# Patient Record
Sex: Female | Born: 1980 | Race: Black or African American | Hispanic: No | Marital: Single | Smoking: Former smoker
Health system: Southern US, Community
[De-identification: ages and names within clinical notes are randomized; demographics above are authoritative.]

## PROBLEM LIST (undated history)

## (undated) ENCOUNTER — Inpatient Hospital Stay (HOSPITAL_COMMUNITY): Payer: Self-pay

## (undated) ENCOUNTER — Emergency Department (HOSPITAL_COMMUNITY): Discharge status: Absent without leave | Payer: Medicaid Other

## (undated) ENCOUNTER — Ambulatory Visit (HOSPITAL_COMMUNITY): Admission: EM | Payer: Medicaid Other | Source: Home / Self Care

## (undated) ENCOUNTER — Ambulatory Visit (HOSPITAL_COMMUNITY): Admission: EM | Source: Home / Self Care

## (undated) DIAGNOSIS — O1420 HELLP syndrome (HELLP), unspecified trimester: Secondary | ICD-10-CM

## (undated) DIAGNOSIS — D649 Anemia, unspecified: Secondary | ICD-10-CM

## (undated) DIAGNOSIS — T7840XA Allergy, unspecified, initial encounter: Secondary | ICD-10-CM

## (undated) DIAGNOSIS — D696 Thrombocytopenia, unspecified: Secondary | ICD-10-CM

## (undated) DIAGNOSIS — F419 Anxiety disorder, unspecified: Secondary | ICD-10-CM

## (undated) DIAGNOSIS — Z87898 Personal history of other specified conditions: Secondary | ICD-10-CM

## (undated) DIAGNOSIS — O139 Gestational [pregnancy-induced] hypertension without significant proteinuria, unspecified trimester: Secondary | ICD-10-CM

## (undated) DIAGNOSIS — O1490 Unspecified pre-eclampsia, unspecified trimester: Secondary | ICD-10-CM

## (undated) HISTORY — PX: TUBAL LIGATION: SHX77

## (undated) HISTORY — DX: Anemia, unspecified: D64.9

## (undated) HISTORY — DX: Anxiety disorder, unspecified: F41.9

## (undated) HISTORY — DX: Personal history of other specified conditions: Z87.898

## (undated) HISTORY — DX: Allergy, unspecified, initial encounter: T78.40XA

## (undated) HISTORY — PX: TOOTH EXTRACTION: SHX859

---
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
## 2000-07-18 DIAGNOSIS — O1420 HELLP syndrome (HELLP), unspecified trimester: Secondary | ICD-10-CM

## 2000-07-18 HISTORY — DX: HELLP syndrome (HELLP), unspecified trimester: O14.20

## 2001-02-27 ENCOUNTER — Encounter (INDEPENDENT_AMBULATORY_CARE_PROVIDER_SITE_OTHER): Payer: Self-pay | Admitting: Specialist

## 2001-02-27 ENCOUNTER — Inpatient Hospital Stay (HOSPITAL_COMMUNITY): Admission: AD | Admit: 2001-02-27 | Discharge: 2001-03-06 | Payer: Self-pay | Admitting: Obstetrics & Gynecology

## 2001-02-28 ENCOUNTER — Encounter: Payer: Self-pay | Admitting: Obstetrics

## 2001-03-01 ENCOUNTER — Encounter: Payer: Self-pay | Admitting: *Deleted

## 2001-03-03 ENCOUNTER — Encounter: Payer: Self-pay | Admitting: *Deleted

## 2001-09-24 ENCOUNTER — Encounter: Payer: Self-pay | Admitting: *Deleted

## 2001-09-24 ENCOUNTER — Ambulatory Visit (HOSPITAL_COMMUNITY): Admission: RE | Admit: 2001-09-24 | Discharge: 2001-09-24 | Payer: Self-pay | Admitting: *Deleted

## 2001-09-26 ENCOUNTER — Encounter: Admission: RE | Admit: 2001-09-26 | Discharge: 2001-09-26 | Payer: Self-pay | Admitting: *Deleted

## 2001-10-03 ENCOUNTER — Observation Stay (HOSPITAL_COMMUNITY): Admission: AD | Admit: 2001-10-03 | Discharge: 2001-10-04 | Payer: Self-pay | Admitting: *Deleted

## 2001-10-03 ENCOUNTER — Encounter: Admission: RE | Admit: 2001-10-03 | Discharge: 2001-10-03 | Payer: Self-pay | Admitting: *Deleted

## 2001-10-10 ENCOUNTER — Encounter: Admission: RE | Admit: 2001-10-10 | Discharge: 2001-10-10 | Payer: Self-pay | Admitting: *Deleted

## 2001-10-11 ENCOUNTER — Inpatient Hospital Stay (HOSPITAL_COMMUNITY): Admission: AD | Admit: 2001-10-11 | Discharge: 2001-10-14 | Payer: Self-pay | Admitting: *Deleted

## 2001-10-24 ENCOUNTER — Ambulatory Visit (HOSPITAL_COMMUNITY): Admission: RE | Admit: 2001-10-24 | Discharge: 2001-10-24 | Payer: Self-pay | Admitting: *Deleted

## 2001-10-25 ENCOUNTER — Encounter: Admission: RE | Admit: 2001-10-25 | Discharge: 2001-10-25 | Payer: Self-pay | Admitting: *Deleted

## 2001-11-15 ENCOUNTER — Encounter: Admission: RE | Admit: 2001-11-15 | Discharge: 2001-11-15 | Payer: Self-pay | Admitting: *Deleted

## 2001-11-16 ENCOUNTER — Inpatient Hospital Stay (HOSPITAL_COMMUNITY): Admission: AD | Admit: 2001-11-16 | Discharge: 2001-11-16 | Payer: Self-pay | Admitting: *Deleted

## 2001-11-22 ENCOUNTER — Encounter: Admission: RE | Admit: 2001-11-22 | Discharge: 2001-11-22 | Payer: Self-pay | Admitting: *Deleted

## 2001-11-29 ENCOUNTER — Inpatient Hospital Stay (HOSPITAL_COMMUNITY): Admission: AD | Admit: 2001-11-29 | Discharge: 2001-12-06 | Payer: Self-pay | Admitting: *Deleted

## 2001-11-29 ENCOUNTER — Encounter (INDEPENDENT_AMBULATORY_CARE_PROVIDER_SITE_OTHER): Payer: Self-pay

## 2002-07-18 DIAGNOSIS — O1490 Unspecified pre-eclampsia, unspecified trimester: Secondary | ICD-10-CM

## 2002-07-18 HISTORY — DX: Unspecified pre-eclampsia, unspecified trimester: O14.90

## 2004-07-04 ENCOUNTER — Emergency Department (HOSPITAL_COMMUNITY): Admission: EM | Admit: 2004-07-04 | Discharge: 2004-07-04 | Payer: Self-pay | Admitting: Emergency Medicine

## 2005-03-24 ENCOUNTER — Emergency Department (HOSPITAL_COMMUNITY): Admission: EM | Admit: 2005-03-24 | Discharge: 2005-03-24 | Payer: Self-pay | Admitting: Emergency Medicine

## 2005-10-25 ENCOUNTER — Emergency Department (HOSPITAL_COMMUNITY): Admission: EM | Admit: 2005-10-25 | Discharge: 2005-10-25 | Payer: Self-pay | Admitting: Family Medicine

## 2005-10-27 ENCOUNTER — Emergency Department (HOSPITAL_COMMUNITY): Admission: EM | Admit: 2005-10-27 | Discharge: 2005-10-28 | Payer: Self-pay | Admitting: Emergency Medicine

## 2005-10-29 ENCOUNTER — Emergency Department (HOSPITAL_COMMUNITY): Admission: EM | Admit: 2005-10-29 | Discharge: 2005-10-29 | Payer: Self-pay | Admitting: Family Medicine

## 2006-01-11 ENCOUNTER — Emergency Department (HOSPITAL_COMMUNITY): Admission: EM | Admit: 2006-01-11 | Discharge: 2006-01-11 | Payer: Self-pay | Admitting: Family Medicine

## 2006-12-24 ENCOUNTER — Emergency Department (HOSPITAL_COMMUNITY): Admission: EM | Admit: 2006-12-24 | Discharge: 2006-12-24 | Payer: Self-pay | Admitting: Emergency Medicine

## 2007-02-12 ENCOUNTER — Emergency Department (HOSPITAL_COMMUNITY): Admission: EM | Admit: 2007-02-12 | Discharge: 2007-02-12 | Payer: Self-pay | Admitting: Emergency Medicine

## 2007-08-14 ENCOUNTER — Emergency Department (HOSPITAL_COMMUNITY): Admission: EM | Admit: 2007-08-14 | Discharge: 2007-08-14 | Payer: Self-pay | Admitting: Emergency Medicine

## 2007-09-06 ENCOUNTER — Emergency Department (HOSPITAL_COMMUNITY): Admission: EM | Admit: 2007-09-06 | Discharge: 2007-09-06 | Payer: Self-pay | Admitting: Emergency Medicine

## 2007-09-26 ENCOUNTER — Inpatient Hospital Stay (HOSPITAL_COMMUNITY): Admission: AD | Admit: 2007-09-26 | Discharge: 2007-09-26 | Payer: Self-pay | Admitting: Obstetrics & Gynecology

## 2007-11-21 ENCOUNTER — Inpatient Hospital Stay (HOSPITAL_COMMUNITY): Admission: AD | Admit: 2007-11-21 | Discharge: 2007-11-21 | Payer: Self-pay | Admitting: Family Medicine

## 2007-12-20 ENCOUNTER — Ambulatory Visit: Payer: Self-pay | Admitting: Obstetrics & Gynecology

## 2007-12-24 ENCOUNTER — Other Ambulatory Visit: Payer: Self-pay | Admitting: Emergency Medicine

## 2007-12-24 ENCOUNTER — Inpatient Hospital Stay (HOSPITAL_COMMUNITY): Admission: AD | Admit: 2007-12-24 | Discharge: 2007-12-25 | Payer: Self-pay | Admitting: Gynecology

## 2007-12-24 ENCOUNTER — Ambulatory Visit: Payer: Self-pay | Admitting: Gynecology

## 2007-12-26 ENCOUNTER — Ambulatory Visit: Payer: Self-pay | Admitting: Obstetrics & Gynecology

## 2008-01-03 ENCOUNTER — Ambulatory Visit: Payer: Self-pay | Admitting: Obstetrics & Gynecology

## 2008-01-10 ENCOUNTER — Ambulatory Visit: Payer: Self-pay | Admitting: Family Medicine

## 2008-01-24 ENCOUNTER — Ambulatory Visit: Payer: Self-pay | Admitting: Family Medicine

## 2008-01-31 ENCOUNTER — Ambulatory Visit: Payer: Self-pay | Admitting: *Deleted

## 2008-01-31 ENCOUNTER — Ambulatory Visit (HOSPITAL_COMMUNITY): Admission: RE | Admit: 2008-01-31 | Discharge: 2008-01-31 | Payer: Self-pay | Admitting: Obstetrics & Gynecology

## 2008-02-11 ENCOUNTER — Ambulatory Visit: Payer: Self-pay | Admitting: Family Medicine

## 2008-02-14 ENCOUNTER — Ambulatory Visit: Payer: Self-pay | Admitting: *Deleted

## 2008-02-25 ENCOUNTER — Ambulatory Visit: Payer: Self-pay | Admitting: Obstetrics & Gynecology

## 2008-03-05 ENCOUNTER — Inpatient Hospital Stay (HOSPITAL_COMMUNITY): Admission: AD | Admit: 2008-03-05 | Discharge: 2008-03-07 | Payer: Self-pay | Admitting: Obstetrics & Gynecology

## 2008-03-05 ENCOUNTER — Encounter: Payer: Self-pay | Admitting: Obstetrics & Gynecology

## 2008-03-08 ENCOUNTER — Inpatient Hospital Stay (HOSPITAL_COMMUNITY): Admission: AD | Admit: 2008-03-08 | Discharge: 2008-03-08 | Payer: Self-pay | Admitting: Family Medicine

## 2008-03-08 ENCOUNTER — Ambulatory Visit: Payer: Self-pay | Admitting: Physician Assistant

## 2009-06-04 ENCOUNTER — Ambulatory Visit (HOSPITAL_COMMUNITY): Admission: RE | Admit: 2009-06-04 | Discharge: 2009-06-04 | Payer: Self-pay | Admitting: Obstetrics

## 2009-07-22 ENCOUNTER — Ambulatory Visit (HOSPITAL_COMMUNITY): Admission: RE | Admit: 2009-07-22 | Discharge: 2009-07-22 | Payer: Self-pay | Admitting: Obstetrics

## 2009-08-06 ENCOUNTER — Inpatient Hospital Stay (HOSPITAL_COMMUNITY): Admission: AD | Admit: 2009-08-06 | Discharge: 2009-08-06 | Payer: Self-pay | Admitting: Obstetrics

## 2009-08-07 ENCOUNTER — Inpatient Hospital Stay (HOSPITAL_COMMUNITY): Admission: AD | Admit: 2009-08-07 | Discharge: 2009-08-10 | Payer: Self-pay | Admitting: Obstetrics

## 2009-08-07 ENCOUNTER — Encounter (INDEPENDENT_AMBULATORY_CARE_PROVIDER_SITE_OTHER): Payer: Self-pay | Admitting: Obstetrics

## 2010-01-25 IMAGING — US US OB FOLLOW-UP
1 series · 14 of 28 positions shown · non-contrast
Comparison: none

OBSTETRICAL ULTRASOUND:
 This ultrasound exam was performed in the [HOSPITAL] Ultrasound Department.  The OB US report was generated in the AS system, and faxed to the ordering physician.  This report is also available in [REDACTED] PACS.

[Series 1: us ob follow-up · 0.30mm/px · 14 of 30 slices shown]
[im 2/30]
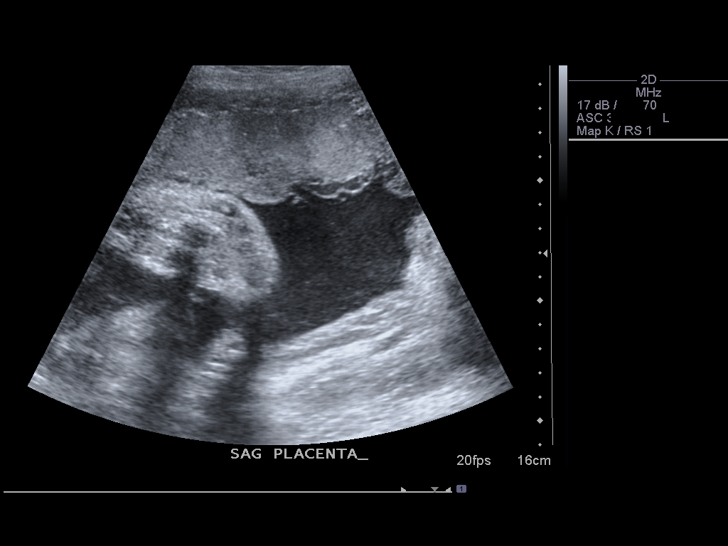
[im 4/30]
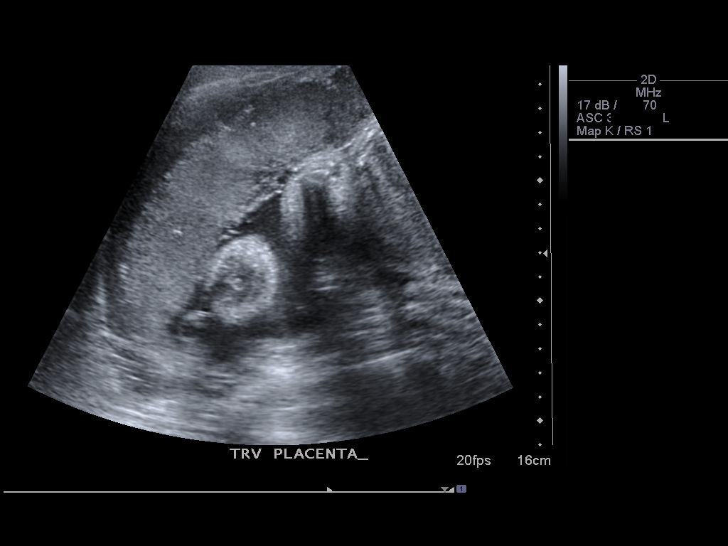
[im 6/30]
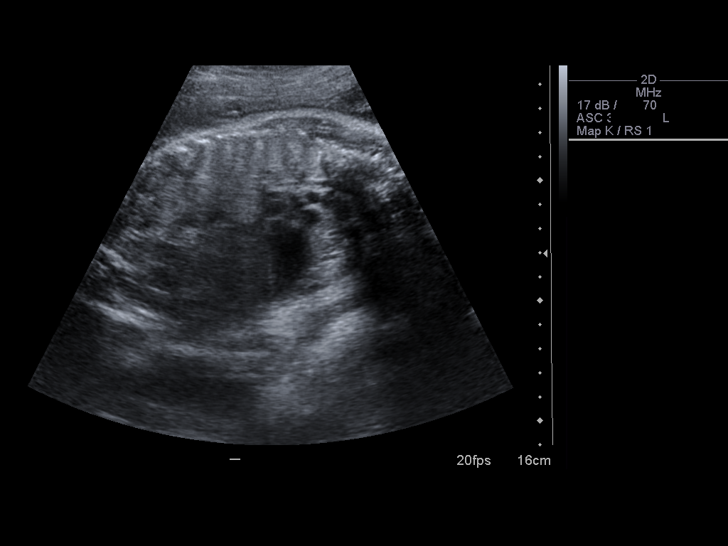
[im 8/30]
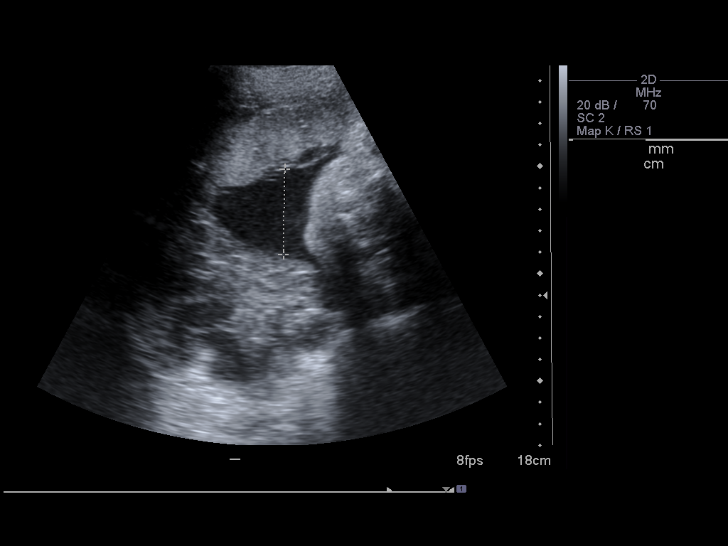
[im 10/30]
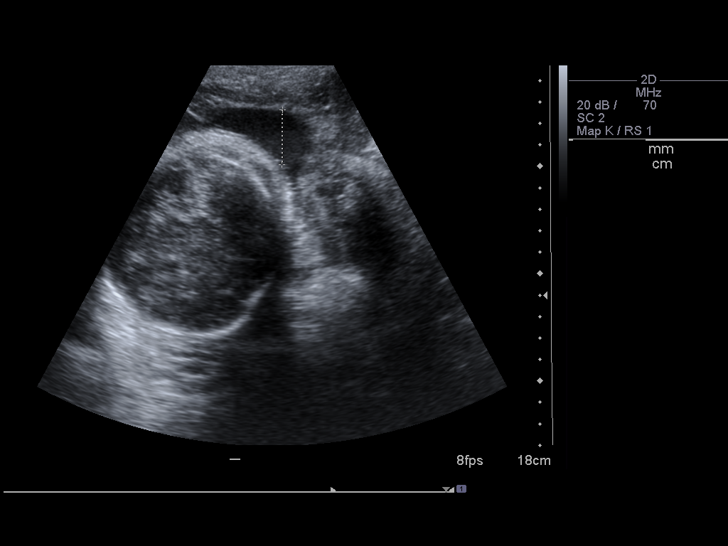
[im 12/30]
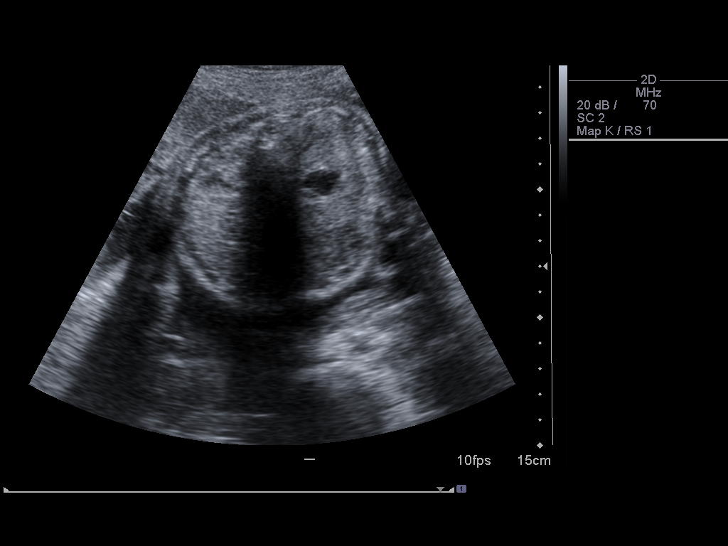
[im 14/30]
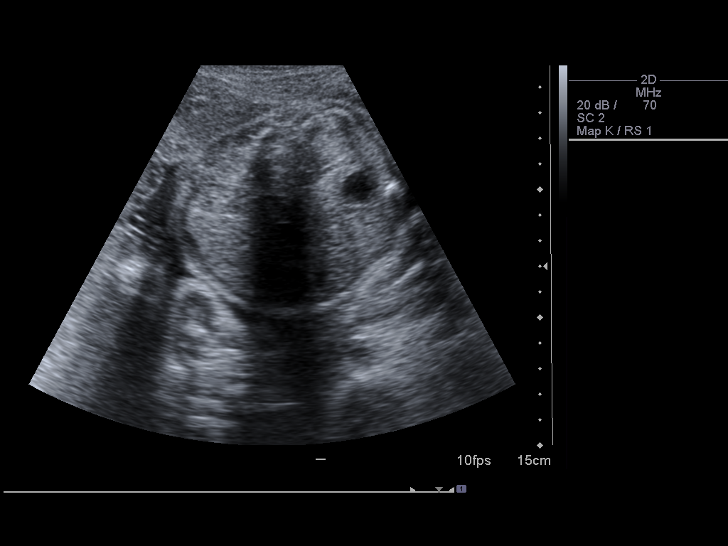
[im 17/30]
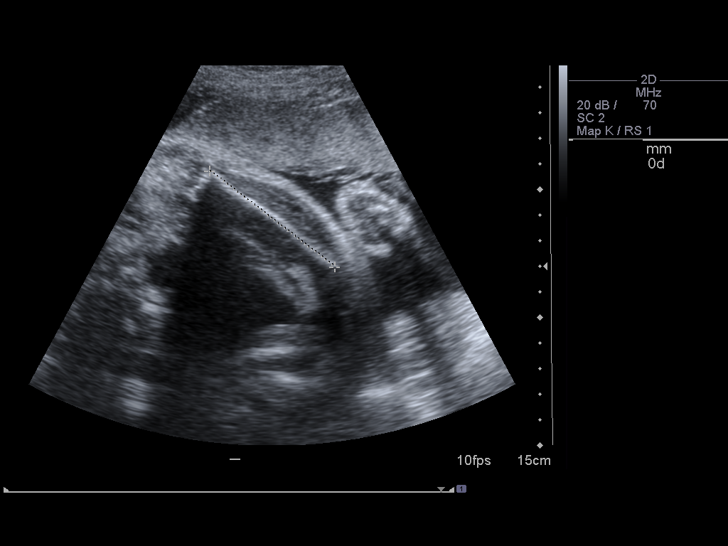
[im 19/30]
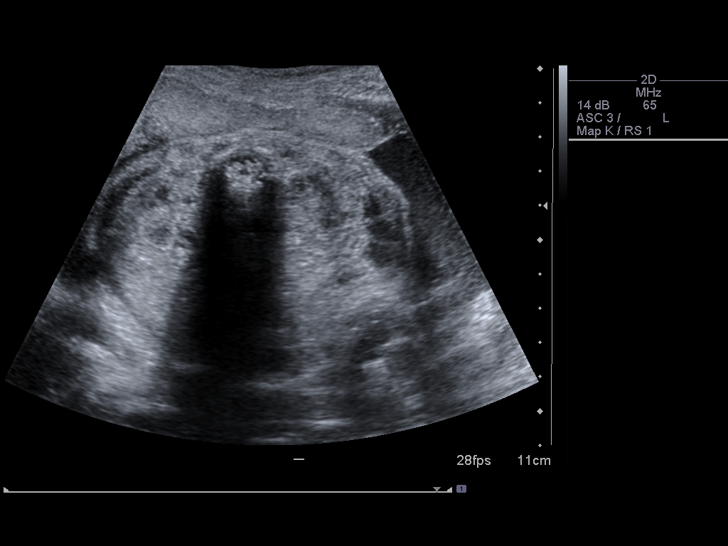
[im 21/30]
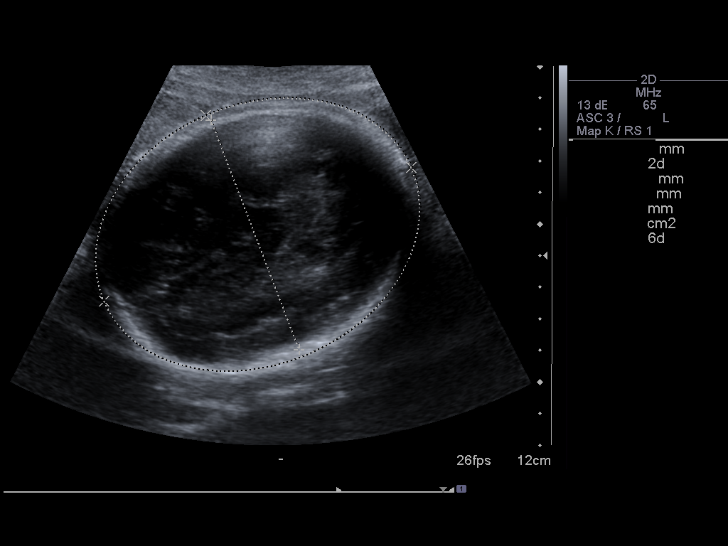
[im 23/30]
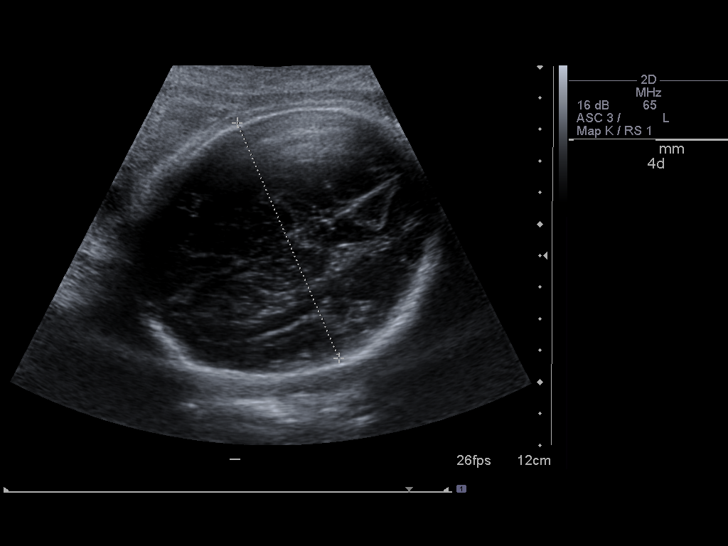
[im 25/30]
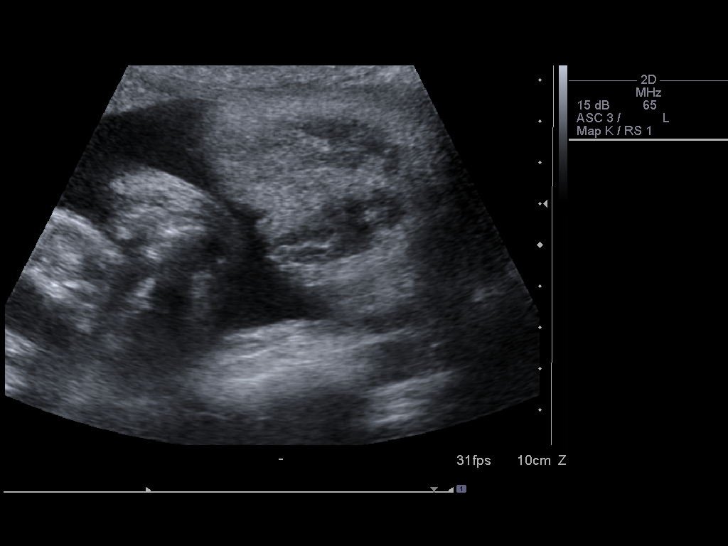
[im 27/30]
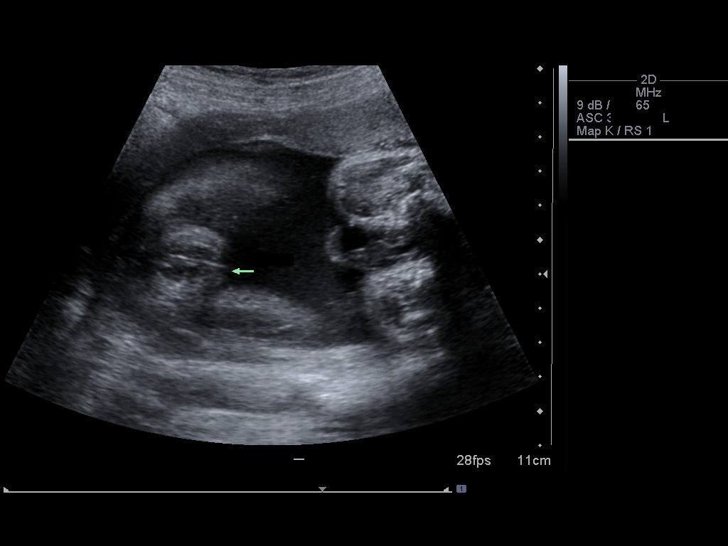
[im 30/30]
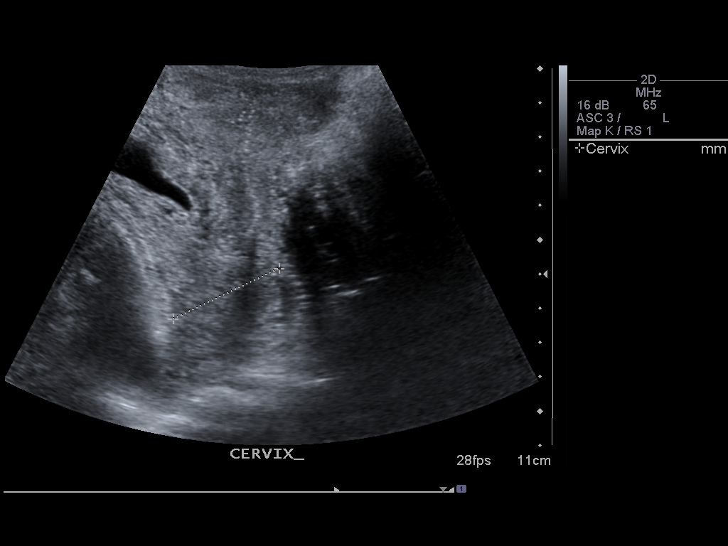

[14 of 28 positions shown; findings below may reference images not displayed]

IMPRESSION: See AS Obstetric US report.

## 2010-10-03 LAB — CBC
HCT: 26.2 % — ABNORMAL LOW (ref 36.0–46.0)
HCT: 26.5 % — ABNORMAL LOW (ref 36.0–46.0)
HCT: 29 % — ABNORMAL LOW (ref 36.0–46.0)
Hemoglobin: 8.5 g/dL — ABNORMAL LOW (ref 12.0–15.0)
Hemoglobin: 8.5 g/dL — ABNORMAL LOW (ref 12.0–15.0)
Hemoglobin: 9.2 g/dL — ABNORMAL LOW (ref 12.0–15.0)
MCHC: 31.8 g/dL (ref 30.0–36.0)
MCHC: 32.1 g/dL (ref 30.0–36.0)
MCHC: 32.6 g/dL (ref 30.0–36.0)
MCV: 77.4 fL — ABNORMAL LOW (ref 78.0–100.0)
MCV: 77.8 fL — ABNORMAL LOW (ref 78.0–100.0)
MCV: 78.7 fL (ref 78.0–100.0)
Platelets: 100 10*3/uL — ABNORMAL LOW (ref 150–400)
Platelets: 97 10*3/uL — ABNORMAL LOW (ref 150–400)
Platelets: 99 10*3/uL — ABNORMAL LOW (ref 150–400)
RBC: 3.37 MIL/uL — ABNORMAL LOW (ref 3.87–5.11)
RBC: 3.39 MIL/uL — ABNORMAL LOW (ref 3.87–5.11)
RBC: 3.72 MIL/uL — ABNORMAL LOW (ref 3.87–5.11)
RDW: 15.8 % — ABNORMAL HIGH (ref 11.5–15.5)
RDW: 15.9 % — ABNORMAL HIGH (ref 11.5–15.5)
RDW: 16.2 % — ABNORMAL HIGH (ref 11.5–15.5)
WBC: 10.3 10*3/uL (ref 4.0–10.5)
WBC: 10.3 10*3/uL (ref 4.0–10.5)
WBC: 7.1 10*3/uL (ref 4.0–10.5)

## 2010-10-03 LAB — COMPREHENSIVE METABOLIC PANEL
ALT: 12 U/L (ref 0–35)
AST: 21 U/L (ref 0–37)
Albumin: 2.7 g/dL — ABNORMAL LOW (ref 3.5–5.2)
Alkaline Phosphatase: 165 U/L — ABNORMAL HIGH (ref 39–117)
BUN: 4 mg/dL — ABNORMAL LOW (ref 6–23)
CO2: 21 mEq/L (ref 19–32)
Calcium: 8.7 mg/dL (ref 8.4–10.5)
Chloride: 106 mEq/L (ref 96–112)
Creatinine, Ser: 0.68 mg/dL (ref 0.4–1.2)
GFR calc Af Amer: >60 mL/min (ref >60)
GFR calc non Af Amer: >60 mL/min (ref >60)
Glucose, Bld: 83 mg/dL (ref 70–99)
Potassium: 3.9 mEq/L (ref 3.5–5.1)
Sodium: 134 mEq/L — ABNORMAL LOW (ref 135–145)
Total Bilirubin: 1.1 mg/dL (ref 0.3–1.2)
Total Protein: 6.3 g/dL (ref 6.0–8.3)

## 2010-10-03 LAB — RPR: RPR Ser Ql: NONREACTIVE

## 2010-10-03 LAB — SAMPLE TO BLOOD BANK

## 2010-11-30 NOTE — Discharge Summary (Signed)
NAME:  Tracy Boyer, Tracy Boyer             ACCOUNT NO.:  [id number]   MEDICAL RECORD NO.:  [id number]          PATIENT TYPE:  WOC   LOCATION:  WOC                          FACILITY:  [facility]   PHYSICIAN:  Tanya S. Shawnie Pons, M.D.   DATE OF BIRTH:  01-06-1981   DATE OF ADMISSION:  DATE OF DISCHARGE:                               DISCHARGE SUMMARY   PREOPERATIVE DIAGNOSES:  1. Intrauterine pregnancy at 36 weeks and 6 days gestation.  2. Previous C-section x2.  3. Thrombocytopenia idiopathic.  4. Declined sterilization.   POSTOPERATIVE DIAGNOSES:  1. Intrauterine pregnancy at 36.6 gestation.  2. Previous C-section x2.  3. Thrombocytopenia idiopathic.  4. Declined sterilization.  5. Repeat C-section.   REASON FOR HOSPITALIZATION:  The patient was admitted on March 05, 2008, for premature rupture of membranes.  The patient was admitted for  repeat C-section after verbalization of not desiring a trial of labor  after cesarean.   PERTINENT LABORATORY DATA:  Blood type B positive, antibody screen  negative, hemoglobin 9.5, hematocrit 28, platelet count 104,000 on  admit, rubella immune, hepatitis B negative, RPR negative, sickle cell  trait negative, HIV nonreactive, and group B strep unknown due to  gestational age.   FINAL DIAGNOSIS:  Preterm repeat C-section due to preterm rupture of  membranes.   SIGNIFICANT FINDINGS:  Birth of a female infant via repeat C-section on  March 05, 2008.  Apgars 8 and 9, weight 6 pounds 2 ounces, no  complications during surgery.   PROCEDURE PERFORMED:  Repeat low transverse C-section.   CONDITION ON DISCHARGE:  The patient is stable.  Vital signs within  normal limits.  Hemoglobin 8.7, hematocrit 27.4, and platelet improves  and increases to 126,000 upon discharge.  The patient was ambulating  without difficulty, voiding, and tolerating p.o. medications with  relief.   INSTRUCTIONS ON DISCHARGE:  The patient is discharged home with follow  up at  [facility] office in 6 weeks.  Staples are to be removed prior to  discharge.  The patient is instructed no lifting greater than 10 pounds  and regular diet, avoid sexual intercourse until followup visit.   MEDICATIONS:  Prescription was written for Percocet 5/325 to be taken 1-  2 q.4-6 h. #40, ibuprofen 600 mg q.6-8 h. p.r.n., Micronor 1 pill daily  for oral contraceptive until follow up in 6 weeks, prenatal vitamins 1  pill daily and Colace 1 pill daily.      Eino Farber Jerolyn Center, CNM      ______________________________  Shelbie Proctor Shawnie Pons, M.D.    WM/MEDQ  D:  03/07/2008  T:  03/07/2008  Job:  [id number]

## 2010-11-30 NOTE — H&P (Signed)
NAME:  Tracy Boyer, Tracy Boyer             ACCOUNT NO.:  [id number]   MEDICAL RECORD NO.:  [id number]          PATIENT TYPE:  INP   LOCATION:  9103                          FACILITY:  [facility]   PHYSICIAN:  Lazaro Arms, M.D.   DATE OF BIRTH:  1981/03/23   DATE OF ADMISSION:  03/05/2008  DATE OF DISCHARGE:                              HISTORY & PHYSICAL   HISTORY OF PRESENT ILLNESS:  The patient is a 30 year old African  American female gravida 5, para 1-3-0-4, estimated date of delivery  March 30, 2008, currently 36 weeks and 6 days.  She has been  scheduled for C-section on March 20, 2008, previous C-section x2.  She came at night with rupture of membranes at approximately 12:15 a.m.  No labor.  Reported good fetal movement.  No  bleeding.  On evaluation,  there is obvious fluid coming from the vagina, copious amounts, with  reactive NST.  Pregnancy is complicated with thrombocytopenia,  idiopathic.  Platelet count on her first visit was 131,000, this morning  is 104,000.  Previous pregnancy had been complicated with preeclampsia  with HELLP syndrome x3 it appears and she was delivered by C-section for  the last 2 pregnancies.   PAST MEDICAL HISTORY:  Negative.   PAST SURGERIES:  2 C-sections.   PAST OB HISTORY:  Stated as above.  This pregnancy is complicated with  positive Chlamydia, otherwise from what I can glean from the chart has  been negative except for thrombocytopenia.   REVIEW OF SYSTEMS:  Otherwise negative.   Blood type is B positive, antibody screen is negative.  Her last  hemoglobin is 9.5 and 28, platelet count is 104,000, this morning,  rubella is immune, hepatitis B is negative, RPR is negative.  Positive  gonorrhea which was back in May 2009, and treated with test of cure  negative, Chlamydia was negative.  Sickle trait was negative.  Pap was  normal.  HIV was nonreactive.  Group B strep has not yet been done.    HEENT is unremarkable.  Thyroid is normal.  Lungs are clear.  Heart showed regular rhythm without regurgitation or gallop.  BREASTS:  Deferred.  ABDOMEN:  Gravid, fundal height of 34 cm.  Cervix not assessed.  Extremities found no edema.  NEUROLOGIC:  Grossly intact.   IMPRESSION:  1. Intrauterine pregnancy at 67 and 6/[redacted] weeks gestation.  2. Previous C-section x2.  3. Thrombocytopenia, stable, idiopathic.  4. Declines sterilization.   PLAN:  The patient was admitted for repeat C-section, understands the  risks, benefits, indications, alternatives, and we will proceed.      Lazaro Arms, M.D.  Electronically Signed     LHE/MEDQ  D:  03/05/2008  T:  03/05/2008  Job:  [id number]

## 2010-11-30 NOTE — Op Note (Signed)
NAME:  Tracy Boyer, Tracy Boyer             ACCOUNT NO.:  [id number]   MEDICAL RECORD NO.:  [id number]          PATIENT TYPE:  INP   LOCATION:  9103                          [facility]ACILITY[facility]  [facility]   PHYSICIAN:  Lazaro Arms, M.D.   DATE OF BIRTH:  1980/11/29   DATE OF PROCEDURE:  03/05/2008  DATE OF DISCHARGE:                               OPERATIVE REPORT   PREOPERATIVE DIAGNOSES:  1. Intrauterine pregnancy 36-6/7 weeks' gestation.  2. Previous cesarean section x2.  3. Thrombocytopenia, idiopathic.  4. Declines sterilization.   POSTOPERATIVE DIAGNOSES:  1. Intrauterine pregnancy 36-6/7 weeks' gestation.  2. Previous cesarean section x2.  3. Thrombocytopenia, idiopathic.  4. Declines sterilization.   PROCEDURE:  Repeat cesarean section.   SURGEON:  Lazaro Arms, MD   ANESTHESIA:  Spinal.   FINDINGS:  Over a low-transverse hysterotomy incision, delivered a  viable female infant at 67 with Apgars of 8 and 9 with the weight to  be determined in the nursery.  Cord pH was drawn and was pending.  Three-  vessel cord blood was also sent.  Placenta was normal.  Sent to  pathology for evaluation.  The patient did have a lot of adhesions to  the anterior abdominal wall, and she basically had an absent abdominal  wall, it went from fascia straight to uterine serosa.  Actually, there  was a broad adhesion in the midline of the uterus to the abdominal wall,  which was taken down sharply.  The adhesion involved the bladder and  omentum.  Uterus, tubes, and ovaries were otherwise normal.   DESCRIPTION OF OPERATION:  The patient was taken to the operating room  and placed in sitting position where she underwent spinal anesthetic.  She was placed in the supine position with a tilt to the left.  She was  prepped and draped in the usual sterile fashion.  A Pfannenstiel skin  incision was made, carried down sharply through rectus fascia, which was  scored in midline and extended laterally.  Fascia was  taken off the  muscles superiorly and inferiorly without difficulty.  There was no  anterior abdominal wall muscles really at all, it was kind of all fused  fascia, muscle and uterine serosa.  She really did not have any on the  right at all; a little bit on the left.  Omentum, bladder, and the  anterior abdominal wall all sort of this big transverse and vertical  adhesion that encompassed really the lower half of the entire uterus,  not the lower uterine segment with entire half of the uterus.  Actually,  I had to take down this broad band adhesion in order to get really to  the uterus to get the baby out.  A low-transverse hysterotomy incision  was then made, and over this incision, a vacuum extractor was used to  deliver the viable female infant at 41 with Apgars of 8 and 9, weight  to be determined in the nursery.  Three-vessel cord, cord blood, and  cord gas were sent.  Placenta was normal anterior.  Infant underwent  routine neonatal resuscitation.  Placenta was  delivered spontaneously.  The uterus was wiped and cleaned with a clean lap pad.  A lot of omental  adhesions were wrapped around the right side, up to the top of the  uterus.  These were clamped and cut and taken down, and again, there was  a lot of adhesions to the uterus of the abdominal wall.  The uterus was  closed in 2 layers, the first being a running interlocking layer with  second being an imbricating layer.  Several interrupted figure-of-eight  sutures were also placed.  During the surgery, the patient was told of  her dense adhesions of the anterior abdominal wall to the uterus, and  told her there is probably high likelihood of bladder injury in the  future based on the findings of this surgery; the patient is aware.  There was good hemostasis.  Pelvis was irrigated vigorously.  The  abdominal wall was kind of closed in mass, there really was not any  significant muscle on the right.  I did bring together  peritoneum to try  to provide some cover to the uterus and then the fascia was closed with  running Vicryl suture.  Subcutaneous tissue was made hemostatic and  irrigated.  Skin was closed using skin staples.  The patient tolerated  the procedure well.  She experienced 500 mL of blood loss, taken to the  recovery room in good and stable condition.  All counts were correct x3.  She will receive Ancef after cord was clamped prophylactically.      Lazaro Arms, M.D.  Electronically Signed     LHE/MEDQ  D:  03/05/2008  T:  03/06/2008  Job:  [id number]

## 2010-12-03 NOTE — Op Note (Signed)
[facility]  Patient:    Tracy Boyer, Tracy Boyer Visit Number: [id number] MRN: [id number]          Service Type: OBS Location: 910A 9143 01 Attending Physician:  Enid Cutter Dictated by:   Ed Blalock. Burnadette Peter, M.D. Proc. Date: 01/11/02 Admit Date:  11/29/2001 Discharge Date: 12/06/2001                             Operative Report  DATE OF BIRTH:                March 24, 1981  PREOPERATIVE DIAGNOSES:       1. Premature rupture of membranes.                               2. Breech presentation.                               3. Decreased variability of fetal heart rate                                  tracing.  POSTOPERATIVE DIAGNOSES:      1. Premature rupture of membranes.                               2. Breech presentation.                               3. Decreased variability of fetal heart rate                                  tracing.  PROCEDURE:                    Low transverse cesarean section.  SURGEON:                      Dr. Orlene Erm  PRIMARY ASSISTANT:            Ed Blalock. Burnadette Peter, M.D.  SECONDARY ASSISTANT:          Conni Elliot, M.D.  ANESTHESIA:                   Spinal.  FLUIDS:                       1900 cc LR.  ESTIMATED BLOOD LOSS:         400 cc.  URINE OUTPUT:                 400 cc clear urine at end of procedure.  FINDINGS:                     Female infant.  Apgars 6 and 9.  Weight 2093 g. Cord pH 7.26.  Breech presentation.  Fetal cord.  INDICATIONS:                  A 30 year old G3, P1-1-0-2 at 34+ weeks by 24 week ultrasound with pre PROM, conversion from cephalic to breech, and declining variability  on fetal heart rate tracing.  Risks, benefits, and alternatives of cesarean section were discussed with patient and she agreed to proceed.  PROCEDURE:                    The patient was taken to the operating room. Spinal was placed.  She was prepped and draped in a sterile fashion.  Incision was made in the  skin and carried down through to the fascia with the Bovie. The fascia was scored and incision extended bilaterally with the Mayo scissors.  The fascia was grasped with Kochers, tented up, and the rectus muscle deflected off.  The same was done with the lower segment of the fascia. The rectus was then separated in the midline and the peritoneum entered.  The incision and peritoneum was extended with the Metzenbaum scissors.  The vesicouterine peritoneum was grasped, tented up, and incised.  The incision was then extended laterally with the Metzenbaum scissors.  The uterus was scored and then incised transversely and the incision extended by stretching. The infant was delivered breech presentation.  Cord was double clamped and cut.  Infant was bulb suctioned.  Infant was handed off to waiting pediatricians.  Cord gas was sent and as noted above.  The uterus was cleared of all clot and debris and repaired in the standard running locked fashion. It was then returned to the abdomen.  The gutters were cleared of all clots and debris and the fascia closed in a standard fashion using Vicryl.  Skin was closed with staples.  The patient tolerated procedure well and was taken to the operating room in stable condition. Dictated by:   Ed Blalock. Burnadette Peter, M.D. Attending Physician:  Enid Cutter DD:  01/11/02 TD:  01/14/02 Job: [id number]

## 2010-12-03 NOTE — Discharge Summary (Signed)
[facility]  Patient:    Tracy Boyer, Tracy Boyer Visit Number: [id number] MRN: [id number]          Service Type: OBS Location: 910A 9134 01 Attending Physician:  Michaelle Copas Dictated by:   Salvadore Dom, M.D. Admit Date:  02/27/2001 Discharge Date: 03/06/2001                             Discharge Summary  DATE OF BIRTH:                Aug 05, 1980  HISTORY OF PRESENT ILLNESS:   This 30 year old G2, P1-0-0-1 presented at 29-3/7 weeks on transfer from [facility] with positive history of contractions, vaginal bleeding and questionable rupture of membranes.  She had positive fetal activity.  The patient had no prenatal care initially.  She was on no known medications.  ALLERGIES:                    ASPIRIN.  Reports she had hives.  OBSTETRICAL HISTORY:          Significant for spontaneous vaginal delivery in 1997.  Denies any complications.  Had a female, 6 pounds 9 ounces.  GYNECOLOGICAL HISTORY:        Denies any STDs.  MEDICAL HISTORY:              None.  SURGICAL HISTORY:             None.  PRENATAL LABS:                Pending.  INITIAL PHYSICAL EXAM:  GENERAL:                      Vital signs stable.  A petite, African American female, who was uncomfortable.  ABDOMEN:                      Nontender, soft.  Fundal height 26 cm.  EXTREMITIES:                  No edema.  DTRs 2+.  SPECULUM EXAM:                A moderate show, with bulging bag of waters.  DIGITAL CERVICAL EXAM:        4/100%/vertex.  Bulging bag of waters.  Fetal heart rate baseline was 150-160 with average variability and some accelerations.  No decelerations.  The patient had an informal baseline ultrasound to confirm presentation, which showed her to be vertex.  She was admitted to the hospital.  Prenatal labs were done.  PIH panel was done.  A GBS culture was done.  She was started on mag sulfate and received betamethasone.  HOSPITAL  COURSE:              The patient initially was placed on 2 g of mag/hr.  This was then increased to 3 g of mag/hr and she was followed from there.  Her contractions decreased and eventually disappeared.  She was followed in the hospital on expectant management.  Received betamethasone x2. Initial PIH panel was negative.  The patient was continued on Unasyn and magnesium at 2 g/hr after contractions ______ .  She was transferred from labor and delivery to antenatal, where she was followed expectantly.  Initial ultrasound showed her to have a UGR with a fetus less than 10th percentile growth.  The patient did have some thrombocytopenia to 95 for the last prior to this one being 106.  This was followed closely.  The patients labs trended downward with the platelet count reaching a low of 88,000, began trending upward and the patient was diagnosed with HELLP syndrome on hospital day No. 4.  Later on hospital day No. 4, the patient began contracting again and complaining of pressure with increasing contractions.  At approximately 2310, she was noted to be complete, had AROM with clear fluid.  She was eventually delivered of a premature female infant with Apgars of 3 at one minute and 7 at five minutes and a weight of 2 pounds 7 ounces.  The infant was transferred to NICU.  The patient was continued on steroids postpartum and was started on postpartum steroids for treatment of her HELLP syndrome.  On postpartum day No. 2, her magnesium was discontinued and she was transferred to the floor. Postpartum day No. 3, the patients HELLP syndrome was resolving.  She had no PIH symptoms.  She was discharged home.  DISCHARGE ACTIVITY:           Pelvic rest.  Otherwise, ad lib.  DISCHARGE DIET:               Regular.  DISCHARGE MEDICATIONS:        1. Motrin 800 mg p.o. t.i.d. p.r.n. pain.                               2. Iron sulfate 325 t.i.d.                               3. Prenatal vitamin p.o.  q.d.  DISCHARGE FOLLOW-UP:          She is to be seen at [facility] in six weeks.  She is to return to the maternity admissions unit for any headaches, scotoma or other difficulties.  DISCHARGE CONDITION:          Improved.  DISCHARGE DIAGNOSES:          1. Preterm labor.                               2. Delivery of preterm female.                               3. Preeclampsia.                               4. HELLP syndrome. Dictated by:   Salvadore Dom, M.D. Attending Physician:  Michaelle Copas DD:  06/11/01 TD:  06/11/01 Job: [id number]

## 2010-12-03 NOTE — Discharge Summary (Signed)
[facility]  Patient:    Tracy Boyer, Tracy Boyer Visit Number: [id number] MRN: [id number]          Service Type: OBS Location: 910A 9147 01 Attending Physician:  Enid Cutter Dictated by:   Ellwood Handler, M.D. Admit Date:  10/11/2001 Discharge Date: 10/14/2001                             Discharge Summary  DATE OF BIRTH:                05/03/1981.  DISCHARGE DIAGNOSES:          1. Intrauterine pregnancy at 28 weeks 5 days.                               2. Preterm uterine contractions.                               3. Positive group B streptococcus.  DISCHARGE MEDICATIONS:        Prenatal vitamins p.o. q.d.  HISTORY OF PRESENT ILLNESS:   Please see full H&P for details. In brief, Tracy Boyer is a 30 year old, gravida 3, para 1-1-0-2, at 28 weeks 5 days who presented to [facility] on October 11, 2001 complaining of painful uterine contractions. Her history is significant for limited prenatal care, history of pelvic inflammatory disease, and positive group B strep. Her obstetrical history is significant for a preterm vaginal delivery at 29 weeks secondary to preeclampsia in 2002. On initial examination, her cervix was 1 cm dilated, thick, and -3 station. Her fetal heart rate was 135-145 and reactive. She was contracting every two to three minutes, and contractions were lasting 50 to 70 seconds and were of moderate intensity. Initial labs included urinalysis, which was negative, wet prep was negative, GC negative, Chlamydia was negative, and fetal fibronectin was positive. The patient was admitted for IV antibiotics and close monitoring.  HOSPITAL COURSE:              During hospitalization, the patient was on bed rest with bathroom privileges. She received IV fluids. She was started on IV Unasyn 3 g q.6h. She was monitored daily with an electronic fetal monitor. By the second day of hospitalization, she was having only occasional contractions. By  day of discharge, she was not having any further contractions and her fetal heart tracing was reactive. She received two doses of betamethasone. At discharge, she had completed a 72-hour course of IV antibiotics.  The patient was discharged to home in stable condition with preterm labor precautions.  DISCHARGE FOLLOWUP:           The patient was instructed to keep her followup appointment at [facility] on October 17, 2001. Dictated by:   Ellwood Handler, M.D. Attending Physician:  Enid Cutter DD:  10/14/01 TD:  10/15/01 Job: [id number]

## 2010-12-03 NOTE — Discharge Summary (Signed)
[facility]  Patient:    Tracy Boyer, Tracy Boyer Visit Number: [id number] MRN: [id number]          Service Type: Attending:  Shearon Balo, M.D. Dictated by:   Vear Clock, M.D. Adm. Date:  11/29/01 Disc. Date: 12/06/01                             Discharge Summary  DISCHARGE DIAGNOSES:          1. Status post low transverse cesarean section.                               2. Delivery of a viable female infant                                  prematurely at approximately 34 weeks.  DISCHARGE MEDICATIONS:        1. Ibuprofen 600 mg p.o. q.6-8h. p.r.n. pain.                               2. Percocet 5/325 one tablet p.o. q.4-6h. p.r.n.                                  severe pain.                               3. Ortho-Evra patch starting Sunday, June 8,                                  20 03 apply one patch q.week x3 weeks, then                                  remove for one week.                               4. Prenatal vitamins one tablet p.o. q.d. x6                                  weeks.  FOLLOWUP:                     The patient is to follow up at [facility] in six weeks.  HISTORY AND PHYSICAL:         A 30 year old G3, P1-1-0-2 at 50 and 0 admitted for SROM.  Pregnancy complications include postpartum preeclampsia and thrombocytopenia in the past.  PRENATAL LABORATORIES:        B+.  Antibody negative.  Rubella immune.  GBS positive.  HOSPITAL COURSE:              The patient was admitted.  Amniotic fluid sent for amniostat and fetal lung maturity studies.  The patient was started on penicillin prophylaxis for GBS positivity in a preterm ruptured membranes.  Of note, patient had been admitted to [facility] the week before with presumed PPROM  at that time as well.  The patient was discharged from [facility].  The patient was hospitalized for approximately four days when she was found to have decreased variability on her  fetal heart rate strip. Presentation was found to be footling breech and given the risks of delivery versus risks of cord prolapse or infection at 34 weeks, patient was taken for cesarean section.  Primary LTCS was performed by Dr. Orlene Erm and Dr. Burnadette Peter with delivery of a viable female infant weighing approximately 2 pounds, Apgars 6 and 9.  The patients postpartum course and postoperative course did involve a postpartum fever for which patient was changed to Unasyn for broader spectrum antibiotic coverage.  The patient did defervesce and was discharged to home without further incident. Dictated by:   Vear Clock, M.D. Attending:  Shearon Balo, M.D. DD:  02/03/02 TD:  02/08/02 Job: [id number]

## 2011-04-07 LAB — URINE CULTURE: Colony Count: 70000

## 2011-04-07 LAB — POCT URINALYSIS DIP (DEVICE)
Bilirubin Urine: NEGATIVE
Glucose, UA: NEGATIVE
Hgb urine dipstick: NEGATIVE
Ketones, ur: NEGATIVE
Nitrite: NEGATIVE
Operator id: 235561
Protein, ur: NEGATIVE
Specific Gravity, Urine: 1.015
Urobilinogen, UA: 1
pH: 7

## 2011-04-07 LAB — POCT PREGNANCY, URINE
Operator id: 235561
Preg Test, Ur: POSITIVE

## 2011-04-08 LAB — I-STAT 8, (EC8 V) (CONVERTED LAB)
Acid-base deficit: 6 — ABNORMAL HIGH
BUN: 4 — ABNORMAL LOW
Bicarbonate: 18.7 — ABNORMAL LOW
Chloride: 109
Glucose, Bld: 68 — ABNORMAL LOW
HCT: 39
Hemoglobin: 13.3
Operator id: 294501
Potassium: 4
Sodium: 137
TCO2: 20
pCO2, Ven: 32 — ABNORMAL LOW
pH, Ven: 7.375 — ABNORMAL HIGH

## 2011-04-08 LAB — POCT PREGNANCY, URINE
Operator id: 294501
Preg Test, Ur: POSITIVE

## 2011-04-08 LAB — POCT I-STAT CREATININE
Creatinine, Ser: 0.9
Operator id: 294501

## 2011-04-08 LAB — URINALYSIS, ROUTINE W REFLEX MICROSCOPIC
Bilirubin Urine: NEGATIVE
Glucose, UA: NEGATIVE
Hgb urine dipstick: NEGATIVE
Ketones, ur: 40 — AB
Nitrite: NEGATIVE
Protein, ur: NEGATIVE
Specific Gravity, Urine: 1.021
Urobilinogen, UA: 2 — ABNORMAL HIGH
pH: 7

## 2011-04-11 LAB — COMPREHENSIVE METABOLIC PANEL
ALT: 17
AST: 15
Albumin: 3.5
Alkaline Phosphatase: 29 — ABNORMAL LOW
BUN: 3 — ABNORMAL LOW
CO2: 21
Calcium: 9.4
Chloride: 105
Creatinine, Ser: 0.63
GFR calc Af Amer: >60
GFR calc non Af Amer: >60
Glucose, Bld: 89
Potassium: 3.6
Sodium: 137
Total Bilirubin: 0.7
Total Protein: 6.8

## 2011-04-11 LAB — URINALYSIS, ROUTINE W REFLEX MICROSCOPIC
Glucose, UA: NEGATIVE
Hgb urine dipstick: NEGATIVE
Ketones, ur: 15 — AB
Protein, ur: NEGATIVE
pH: 6

## 2011-04-11 LAB — CBC
HCT: 37.2
Hemoglobin: 12.9
MCHC: 34.7
MCV: 85.8
Platelets: 135 — ABNORMAL LOW
RBC: 4.34
RDW: 12.8
WBC: 8.4

## 2011-04-11 LAB — DIFFERENTIAL
Basophils Relative: 0
Eosinophils Absolute: 0
Eosinophils Relative: 0
Monocytes Absolute: 0.7
Monocytes Relative: 8
Neutrophils Relative %: 64

## 2011-04-11 LAB — URINE CULTURE: Colony Count: 90000

## 2011-04-13 LAB — URINALYSIS, ROUTINE W REFLEX MICROSCOPIC
Nitrite: NEGATIVE
Protein, ur: NEGATIVE
Specific Gravity, Urine: 1.015
Urobilinogen, UA: >8 — ABNORMAL HIGH

## 2011-04-14 LAB — POCT URINALYSIS DIP (DEVICE)
Bilirubin Urine: NEGATIVE
Glucose, UA: NEGATIVE
Hgb urine dipstick: NEGATIVE
Hgb urine dipstick: NEGATIVE
Hgb urine dipstick: NEGATIVE
Hgb urine dipstick: NEGATIVE
Hgb urine dipstick: NEGATIVE
Nitrite: NEGATIVE
Nitrite: POSITIVE — AB
Nitrite: NEGATIVE
Nitrite: POSITIVE — AB
Nitrite: NEGATIVE
Protein, ur: 30 — AB
Protein, ur: NEGATIVE
Protein, ur: NEGATIVE
Urobilinogen, UA: 4 — ABNORMAL HIGH
Urobilinogen, UA: >=8
Urobilinogen, UA: 4 — ABNORMAL HIGH
Urobilinogen, UA: >=8
pH: 6
pH: 6
pH: 6.5
pH: 7.5
pH: 7

## 2011-04-14 LAB — POCT I-STAT, CHEM 8
Glucose, Bld: 81
HCT: 32 — ABNORMAL LOW
Hemoglobin: 10.9 — ABNORMAL LOW
Potassium: 3.4 — ABNORMAL LOW
Sodium: 138
TCO2: 16

## 2011-04-15 LAB — POCT URINALYSIS DIP (DEVICE)
Bilirubin Urine: NEGATIVE
Bilirubin Urine: NEGATIVE
Hgb urine dipstick: NEGATIVE
Hgb urine dipstick: NEGATIVE
Hgb urine dipstick: NEGATIVE
Ketones, ur: NEGATIVE
Nitrite: NEGATIVE
Nitrite: NEGATIVE
Protein, ur: NEGATIVE
Protein, ur: NEGATIVE
Protein, ur: NEGATIVE
pH: 7
pH: 7
pH: 7

## 2011-05-02 LAB — GC/CHLAMYDIA PROBE AMP, GENITAL: GC Probe Amp, Genital: NEGATIVE

## 2011-05-02 LAB — POCT URINALYSIS DIP (DEVICE)
Glucose, UA: NEGATIVE
Operator id: 239701
Specific Gravity, Urine: >=1.03

## 2011-05-02 LAB — WET PREP, GENITAL
Trich, Wet Prep: NONE SEEN
Yeast Wet Prep HPF POC: NONE SEEN

## 2011-05-05 LAB — WET PREP, GENITAL: Trich, Wet Prep: NONE SEEN

## 2011-05-05 LAB — POCT URINALYSIS DIP (DEVICE)
Ketones, ur: NEGATIVE
Protein, ur: 30 — AB
Specific Gravity, Urine: 1.015
pH: 8.5 — ABNORMAL HIGH

## 2011-05-05 LAB — GC/CHLAMYDIA PROBE AMP, GENITAL: GC Probe Amp, Genital: NEGATIVE

## 2011-06-06 ENCOUNTER — Emergency Department (HOSPITAL_COMMUNITY)
Admission: EM | Admit: 2011-06-06 | Discharge: 2011-06-07 | Discharge status: Against Medical Advice | Payer: Medicaid Other | Attending: Emergency Medicine | Admitting: Emergency Medicine

## 2011-06-06 ENCOUNTER — Encounter: Payer: Self-pay | Admitting: *Deleted

## 2011-06-06 DIAGNOSIS — R1031 Right lower quadrant pain: Secondary | ICD-10-CM | POA: Insufficient documentation

## 2011-06-06 NOTE — ED Notes (Signed)
Pt in c/o RLQ abd pain since yesterday, states area has become more tender today, denies N/V

## 2011-06-09 NOTE — ED Provider Notes (Signed)
History     CSN: [id number] Arrival date & time: 06/06/2011  8:20 PM   First MD Initiated Contact with Patient 06/06/11 2033      Chief Complaint  Patient presents with  . Abdominal Pain    (Consider location/radiation/quality/duration/timing/severity/associated sxs/prior treatment) HPI  History reviewed. No pertinent past medical history.  History reviewed. No pertinent past surgical history.  History reviewed. No pertinent family history.  History  Substance Use Topics  . Smoking status: Never Smoker   . Smokeless tobacco: Not on file  . Alcohol Use: No    OB History    Grav Para Term Preterm Abortions TAB SAB Ect Mult Living                  Review of Systems  Allergies  Aspirin  Home Medications   Current Outpatient Rx  Name Route Sig Dispense Refill  . IBUPROFEN 200 MG PO TABS Oral Take 800 mg by mouth every 6 (six) hours as needed. Used for pain       BP 113/66  Pulse 67  Temp(Src) 97.4 F (36.3 C) (Oral)  Resp 15  SpO2 98%  LMP 05/06/2011  Physical Exam  ED Course  Procedures (including critical care time)  Labs Reviewed - No data to display No results found.   No diagnosis found.    MDM   Pt left after triage        Gwyneth Sprout, MD 06/09/11 [phone]

## 2012-07-23 ENCOUNTER — Other Ambulatory Visit: Payer: Self-pay

## 2012-07-31 ENCOUNTER — Encounter (HOSPITAL_COMMUNITY): Payer: Self-pay

## 2012-07-31 ENCOUNTER — Ambulatory Visit (HOSPITAL_COMMUNITY)
Admission: RE | Admit: 2012-07-31 | Discharge: 2012-07-31 | Disposition: A | Discharge status: Home / Self Care | Payer: Medicaid Other | Source: Ambulatory Visit | Attending: Obstetrics | Admitting: Obstetrics

## 2012-07-31 DIAGNOSIS — D689 Coagulation defect, unspecified: Secondary | ICD-10-CM | POA: Insufficient documentation

## 2012-07-31 DIAGNOSIS — D696 Thrombocytopenia, unspecified: Secondary | ICD-10-CM | POA: Insufficient documentation
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
## 2012-08-07 ENCOUNTER — Institutional Professional Consult (permissible substitution): Payer: Medicaid Other | Admitting: Nurse Practitioner

## 2012-08-14 ENCOUNTER — Ambulatory Visit (INDEPENDENT_AMBULATORY_CARE_PROVIDER_SITE_OTHER): Payer: Medicaid Other | Admitting: Nurse Practitioner

## 2012-08-14 ENCOUNTER — Encounter: Payer: Self-pay | Admitting: Nurse Practitioner

## 2012-08-14 VITALS — BP 108/64 | HR 88 | Ht 62.0 in | Wt 132.0 lb

## 2012-08-14 DIAGNOSIS — Z3490 Encounter for supervision of normal pregnancy, unspecified, unspecified trimester: Secondary | ICD-10-CM

## 2012-08-14 DIAGNOSIS — R413 Other amnesia: Secondary | ICD-10-CM

## 2012-08-14 DIAGNOSIS — G43909 Migraine, unspecified, not intractable, without status migrainosus: Secondary | ICD-10-CM | POA: Insufficient documentation

## 2012-08-14 NOTE — Patient Instructions (Signed)
Migraine Headache A migraine headache is an intense, throbbing pain on one or both sides of your head. A migraine can last for 30 minutes to several hours. CAUSES  The exact cause of a migraine headache is not always known. However, a migraine may be caused when nerves in the brain become irritated and release chemicals that cause inflammation. This causes pain. SYMPTOMS  Pain on one or both sides of your head.  Pulsating or throbbing pain.  Severe pain that prevents daily activities.  Pain that is aggravated by any physical activity.  Nausea, vomiting, or both.  Dizziness.  Pain with exposure to bright lights, loud noises, or activity.  General sensitivity to bright lights, loud noises, or smells. Before you get a migraine, you may get warning signs that a migraine is coming (aura). An aura may include:  Seeing flashing lights.  Seeing bright spots, halos, or zig-zag lines.  Having tunnel vision or blurred vision.  Having feelings of numbness or tingling.  Having trouble talking.  Having muscle weakness. MIGRAINE TRIGGERS  Alcohol.  Smoking.  Stress.  Menstruation.  Aged cheeses.  Foods or drinks that contain nitrates, glutamate, aspartame, or tyramine.  Lack of sleep.  Chocolate.  Caffeine.  Hunger.  Physical exertion.  Fatigue.  Medicines used to treat chest pain (nitroglycerine), birth control pills, estrogen, and some blood pressure medicines. DIAGNOSIS  A migraine headache is often diagnosed based on:  Symptoms.  Physical examination.  A CT scan or MRI of your head. TREATMENT Medicines may be given for pain and nausea. Medicines can also be given to help prevent recurrent migraines.  HOME CARE INSTRUCTIONS  Only take over-the-counter or prescription medicines for pain or discomfort as directed by your caregiver. The use of long-term narcotics is not recommended.  Lie down in a dark, quiet room when you have a migraine.  Keep a journal  to find out what may trigger your migraine headaches. For example, write down:  What you eat and drink.  How much sleep you get.  Any change to your diet or medicines.  Limit alcohol consumption.  Quit smoking if you smoke.  Get 7 to 9 hours of sleep, or as recommended by your caregiver.  Limit stress.  Keep lights dim if bright lights bother you and make your migraines worse. SEEK IMMEDIATE MEDICAL CARE IF:   Your migraine becomes severe.  You have a fever.  You have a stiff neck.  You have vision loss.  You have muscular weakness or loss of muscle control.  You start losing your balance or have trouble walking.  You feel faint or pass out.  You have severe symptoms that are different from your first symptoms. MAKE SURE YOU:   Understand these instructions.  Will watch your condition.  Will get help right away if you are not doing well or get worse. Document Released: 07/04/2005 Document Revised: 09/26/2011 Document Reviewed: 06/24/2011 Exit[employer] Patient Information 2013 [employer].  
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
## 2012-08-29 ENCOUNTER — Encounter: Payer: Self-pay | Admitting: Neurology

## 2012-08-29 ENCOUNTER — Ambulatory Visit (INDEPENDENT_AMBULATORY_CARE_PROVIDER_SITE_OTHER): Payer: Medicaid Other | Admitting: Neurology

## 2012-08-29 VITALS — BP 90/62 | HR 80 | Temp 97.5°F | Resp 16 | Ht 62.0 in | Wt 135.0 lb

## 2012-08-29 DIAGNOSIS — G43119 Migraine with aura, intractable, without status migrainosus: Secondary | ICD-10-CM

## 2012-08-29 DIAGNOSIS — G43819 Other migraine, intractable, without status migrainosus: Secondary | ICD-10-CM

## 2012-08-29 MED ORDER — PROPRANOLOL HCL ER 60 MG PO CP24: 60.0000 mg | ORAL_CAPSULE | Freq: Every day | ORAL | Status: DC

## 2012-08-29 NOTE — Patient Instructions (Addendum)
Your MRI is scheduled at [facility] on Monday, February 17th at 10:00 am. Please check in at the first floor radiology department 15 minutes prior to your scheduled appointment time. Enter the hospital at [address] Entrance A.   [phone].
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
## 2012-09-03 ENCOUNTER — Ambulatory Visit (HOSPITAL_COMMUNITY): Payer: Medicaid Other

## 2012-09-04 ENCOUNTER — Ambulatory Visit (HOSPITAL_COMMUNITY): Payer: Medicaid Other

## 2012-09-07 ENCOUNTER — Ambulatory Visit (HOSPITAL_COMMUNITY)
Admission: RE | Admit: 2012-09-07 | Discharge: 2012-09-07 | Disposition: A | Discharge status: Home / Self Care | Payer: Medicaid Other | Source: Ambulatory Visit | Attending: Neurology | Admitting: Neurology

## 2012-09-07 DIAGNOSIS — R51 Headache: Secondary | ICD-10-CM | POA: Insufficient documentation

## 2012-10-03 ENCOUNTER — Ambulatory Visit: Payer: Medicaid Other | Admitting: Neurology

## 2012-10-25 ENCOUNTER — Other Ambulatory Visit: Payer: Self-pay

## 2012-11-06 ENCOUNTER — Other Ambulatory Visit (HOSPITAL_COMMUNITY): Payer: Self-pay | Admitting: Obstetrics

## 2012-11-06 ENCOUNTER — Ambulatory Visit (HOSPITAL_COMMUNITY)
Admission: RE | Admit: 2012-11-06 | Discharge: 2012-11-06 | Disposition: A | Discharge status: Home / Self Care | Payer: Medicaid Other | Source: Ambulatory Visit | Attending: Obstetrics | Admitting: Obstetrics

## 2012-11-06 DIAGNOSIS — Z3689 Encounter for other specified antenatal screening: Secondary | ICD-10-CM

## 2012-11-06 DIAGNOSIS — O1423 HELLP syndrome (HELLP), third trimester: Secondary | ICD-10-CM

## 2012-11-06 DIAGNOSIS — D696 Thrombocytopenia, unspecified: Secondary | ICD-10-CM | POA: Insufficient documentation

## 2012-11-06 DIAGNOSIS — D689 Coagulation defect, unspecified: Secondary | ICD-10-CM | POA: Insufficient documentation

## 2012-11-06 DIAGNOSIS — Z8751 Personal history of pre-term labor: Secondary | ICD-10-CM | POA: Insufficient documentation

## 2012-11-06 LAB — CBC
MCH: 28.3 pg (ref 26.0–34.0)
Platelets: 84 10*3/uL — ABNORMAL LOW (ref 150–400)
RBC: 4.14 MIL/uL (ref 3.87–5.11)
WBC: 5.3 10*3/uL (ref 4.0–10.5)

## 2012-11-06 LAB — URIC ACID: Uric Acid, Serum: 3.7 mg/dL (ref 2.4–7.0)

## 2012-11-06 LAB — COMPREHENSIVE METABOLIC PANEL
ALT: 7 U/L (ref 0–35)
AST: 12 U/L (ref 0–37)
CO2: 21 mEq/L (ref 19–32)
Calcium: 9 mg/dL (ref 8.4–10.5)
GFR calc non Af Amer: >90 mL/min (ref >90)
Sodium: 135 mEq/L (ref 135–145)
Total Protein: 6.6 g/dL (ref 6.0–8.3)
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
## 2012-11-07 LAB — CREATININE CLEARANCE, URINE, 24 HOUR
Creatinine, 24H Ur: 1207 mg/d (ref 700–1800)
Urine Total Volume-CRCL: 950 mL

## 2012-11-07 LAB — PROTEIN, URINE, 24 HOUR
Collection Interval-UPROT: 24 hours
Protein, Urine: 6 mg/dL

## 2012-11-12 ENCOUNTER — Telehealth (HOSPITAL_COMMUNITY): Payer: Self-pay | Admitting: *Deleted

## 2012-11-12 NOTE — Telephone Encounter (Signed)
Called patient to inform her of her lab results from 24 hour urine and blood work.  All lab results reviewed with Dr. Claudean Severance and found to be normal.  Her platelets had dropped to 84 from 97 at her doctors office.  She is going back on the 10th to have another level drawn.  Reviewed that her doctor will continue to monitor her platelets and follow our doctor's recommendations.  Pt verbalized understanding.

## 2012-12-09 ENCOUNTER — Encounter (HOSPITAL_COMMUNITY): Payer: Self-pay | Admitting: *Deleted

## 2012-12-09 ENCOUNTER — Inpatient Hospital Stay (HOSPITAL_COMMUNITY)
Admission: AD | Admit: 2012-12-09 | Discharge: 2012-12-09 | Disposition: A | Discharge status: Home / Self Care | Payer: Medicaid Other | Source: Ambulatory Visit | Attending: Obstetrics | Admitting: Obstetrics

## 2012-12-09 DIAGNOSIS — O99891 Other specified diseases and conditions complicating pregnancy: Secondary | ICD-10-CM | POA: Insufficient documentation

## 2012-12-09 DIAGNOSIS — O4700 False labor before 37 completed weeks of gestation, unspecified trimester: Secondary | ICD-10-CM | POA: Insufficient documentation

## 2012-12-09 DIAGNOSIS — K920 Hematemesis: Secondary | ICD-10-CM | POA: Insufficient documentation

## 2012-12-09 DIAGNOSIS — N949 Unspecified condition associated with female genital organs and menstrual cycle: Secondary | ICD-10-CM | POA: Insufficient documentation

## 2012-12-09 DIAGNOSIS — O26893 Other specified pregnancy related conditions, third trimester: Secondary | ICD-10-CM

## 2012-12-09 HISTORY — DX: Thrombocytopenia, unspecified: D69.6

## 2012-12-09 HISTORY — DX: Gestational (pregnancy-induced) hypertension without significant proteinuria, unspecified trimester: O13.9

## 2012-12-09 HISTORY — DX: HELLP syndrome (HELLP), unspecified trimester: O14.20

## 2012-12-09 HISTORY — DX: Unspecified pre-eclampsia, unspecified trimester: O14.90

## 2012-12-09 LAB — URINALYSIS, ROUTINE W REFLEX MICROSCOPIC
Ketones, ur: NEGATIVE mg/dL
Leukocytes, UA: NEGATIVE
Nitrite: NEGATIVE
Urobilinogen, UA: 1 mg/dL (ref 0.0–1.0)
pH: 6 (ref 5.0–8.0)

## 2012-12-09 LAB — COMPREHENSIVE METABOLIC PANEL
ALT: 7 U/L (ref 0–35)
AST: 13 U/L (ref 0–37)
Albumin: 2.6 g/dL — ABNORMAL LOW (ref 3.5–5.2)
Alkaline Phosphatase: 70 U/L (ref 39–117)
Chloride: 106 mEq/L (ref 96–112)
Potassium: 3.6 mEq/L (ref 3.5–5.1)
Sodium: 137 mEq/L (ref 135–145)
Total Bilirubin: 0.4 mg/dL (ref 0.3–1.2)

## 2012-12-09 LAB — TYPE AND SCREEN: Antibody Screen: NEGATIVE

## 2012-12-09 LAB — CBC
MCH: 27.8 pg (ref 26.0–34.0)
Platelets: 89 10*3/uL — ABNORMAL LOW (ref 150–400)
RBC: 4.06 MIL/uL (ref 3.87–5.11)
WBC: 5.8 10*3/uL (ref 4.0–10.5)

## 2012-12-09 LAB — URINE MICROSCOPIC-ADD ON

## 2012-12-09 MED ORDER — ACETAMINOPHEN-CODEINE #3 300-30 MG PO TABS: 1.0000 | ORAL_TABLET | ORAL | Status: DC | PRN

## 2012-12-09 MED ORDER — CALCIUM CARBONATE ANTACID 500 MG PO CHEW
2.0000 | CHEWABLE_TABLET | ORAL | Status: DC | PRN
  Filled 2012-12-09: qty 2

## 2012-12-09 MED ORDER — ONDANSETRON 4 MG PO TBDP
4.0000 mg | ORAL_TABLET | Freq: Four times a day (QID) | ORAL | Status: DC | PRN
  Filled 2012-12-09: qty 1

## 2012-12-09 MED ORDER — ONDANSETRON 8 MG PO TBDP
8.0000 mg | ORAL_TABLET | Freq: Once | ORAL | Status: AC
  Administered 2012-12-09: 8 mg via ORAL
  Filled 2012-12-09: qty 1

## 2012-12-09 MED ORDER — ZOLPIDEM TARTRATE 5 MG PO TABS: 5.0000 mg | ORAL_TABLET | Freq: Every evening | ORAL | Status: DC | PRN

## 2012-12-09 MED ORDER — DOCUSATE SODIUM 100 MG PO CAPS
100.0000 mg | ORAL_CAPSULE | Freq: Every day | ORAL | Status: DC
  Administered 2012-12-09: 100 mg via ORAL
  Filled 2012-12-09 (×2): qty 1

## 2012-12-09 MED ORDER — ACETAMINOPHEN-CODEINE #3 300-30 MG PO TABS
1.0000 | ORAL_TABLET | ORAL | Status: DC | PRN
  Administered 2012-12-09: 1 via ORAL
  Filled 2012-12-09: qty 1

## 2012-12-09 MED ORDER — ONDANSETRON 4 MG PO TBDP: 4.0000 mg | ORAL_TABLET | Freq: Three times a day (TID) | ORAL | Status: DC | PRN

## 2012-12-09 MED ORDER — PRENATAL MULTIVITAMIN CH
1.0000 | ORAL_TABLET | Freq: Every day | ORAL | Status: DC
  Administered 2012-12-09: 1 via ORAL
  Filled 2012-12-09 (×2): qty 1

## 2012-12-09 MED ORDER — ZOLPIDEM TARTRATE 5 MG PO TABS
5.0000 mg | ORAL_TABLET | Freq: Every evening | ORAL | Status: DC | PRN
  Administered 2012-12-09: 5 mg via ORAL
  Filled 2012-12-09: qty 1

## 2012-12-09 NOTE — MAU Note (Signed)
Patient complained of leaking fluid after vomiting. Fern collected was negative.

## 2012-12-09 NOTE — Progress Notes (Signed)
Patient given discharge instructions on preterm labor and abdominal pain. I gave her 2 prescriptions and told her where to pick up her other prescription. I answered all questions, and told the patient to get dressed and we would have her sign the discharge papers and a staff member would walk her out. When I checked back on the patient, she had already left the room without signing the discharge papers. However, she took 2 copies home with her and verbalized understanding of the instructions and that she needed to call Dr. Gaynell Face to set up another appointment as soon as she can.

## 2012-12-09 NOTE — MAU Provider Note (Signed)
History     CSN: [id number]  Arrival date and time: 12/09/12 0245   First Provider Initiated Contact with Patient 12/09/12 0256      Chief Complaint  Patient presents with  . Contractions   HPI  Pt is a [id number] at 33.2 wks IUP here via EMS due to contractions and pressure.  While experiencing pelvic pressure pt also reports vomiting.  The vomitus in last two episodes were blood tinged.  No report of vaginal bleeding or leaking of fluid.  Reports pain feels like pelvis is pulling apart.    Past Medical History  Diagnosis Date  . Allergy   . Anemia   . Anxiety   . History of abnormal Pap smear 1997  . HELLP syndrome 2002  . Pregnancy induced hypertension   . Preeclampsia 2004  . Thrombocytopenia     Past Surgical History  Procedure Laterality Date  . Cesarean section      x4  . Tooth extraction      x6    Family History  Problem Relation Age of Onset  . Hypertension Mother     History  Substance Use Topics  . Smoking status: Never Smoker   . Smokeless tobacco: Not on file  . Alcohol Use: No    Allergies: No Known Allergies  Prescriptions prior to admission  Medication Sig Dispense Refill  . acetaminophen-codeine (TYLENOL #3) 300-30 MG per tablet Take 1 tablet by mouth every 4 (four) hours as needed.      Marland Kitchen ibuprofen (ADVIL,MOTRIN) 200 MG tablet Take 800 mg by mouth every 6 (six) hours as needed. Used for pain       . Prenatal Vit-Fe Fumarate-FA (MULTIVITAMIN-PRENATAL) 27-0.8 MG TABS Take 1 tablet by mouth daily.      . propranolol ER (INDERAL LA) 60 MG 24 hr capsule Take 1 capsule (60 mg total) by mouth daily.  30 capsule  3    Review of Systems  Constitutional: Negative for fever and chills.  HENT: Negative for nosebleeds.   Respiratory: Negative for cough.   Gastrointestinal: Positive for nausea, vomiting and abdominal pain (pelvic pressure).  Genitourinary: Positive for dysuria, urgency and frequency. Negative for hematuria.  Neurological: Negative  for headaches.  All other systems reviewed and are negative.   Physical Exam   Blood pressure 108/70, pulse 86, temperature 97.9 F (36.6 C), temperature source Oral, resp. rate 20, height 5\' 2"  (1.575 m), weight 66.679 kg (147 lb), last menstrual period 04/20/2012, SpO2 100.00%.  Physical Exam  Constitutional: She is oriented to person, place, and time. She appears well-developed and well-nourished.  HENT:  Head: Normocephalic.  Neck: Normal range of motion. Neck supple.  Cardiovascular: Normal rate, regular rhythm and normal heart sounds.   Respiratory: Effort normal and breath sounds normal. No respiratory distress.  GI: Soft. There is no tenderness.  Genitourinary: No bleeding around the vagina. Vaginal discharge (mucusy) found.  Musculoskeletal: Normal range of motion. She exhibits no edema.  Neurological: She is alert and oriented to person, place, and time.  Skin: Skin is warm and dry.  Dilation: Closed Exam by:: W. Jerolyn Center CNM   FHR 130's, +accels, reative Toco - irregular  Consulted with Dr Clearance Coots > Due to severity of pt reported pain and hx of csection x 4 > admit for observation MAU Course  Procedures  Results for orders placed during the hospital encounter of 12/09/12 (from the past 24 hour(s))  URINALYSIS, ROUTINE W REFLEX MICROSCOPIC     Status: Abnormal  Collection Time    12/09/12  3:06 AM      Result Value Range   Color, Urine YELLOW  YELLOW   APPearance CLEAR  CLEAR   Specific Gravity, Urine <1.005 (*) 1.005 - 1.030   pH 6.0  5.0 - 8.0   Glucose, UA NEGATIVE  NEGATIVE mg/dL   Hgb urine dipstick TRACE (*) NEGATIVE   Bilirubin Urine NEGATIVE  NEGATIVE   Ketones, ur NEGATIVE  NEGATIVE mg/dL   Protein, ur NEGATIVE  NEGATIVE mg/dL   Urobilinogen, UA 1.0  0.0 - 1.0 mg/dL   Nitrite NEGATIVE  NEGATIVE   Leukocytes, UA NEGATIVE  NEGATIVE  URINE MICROSCOPIC-ADD ON     Status: None   Collection Time    12/09/12  3:06 AM      Result Value Range   Squamous  Epithelial / LPF RARE  RARE   WBC, UA 0-2  <3 WBC/hpf   RBC / HPF 0-2  <3 RBC/hpf   Bacteria, UA RARE  RARE  CBC     Status: Abnormal   Collection Time    12/09/12  3:15 AM      Result Value Range   WBC 5.8  4.0 - 10.5 K/uL   RBC 4.06  3.87 - 5.11 MIL/uL   Hemoglobin 11.3 (*) 12.0 - 15.0 g/dL   HCT 16.1 (*) 09.6 - 04.5 %   MCV 82.3  78.0 - 100.0 fL   MCH 27.8  26.0 - 34.0 pg   MCHC 33.8  30.0 - 36.0 g/dL   RDW 40.9  81.1 - 91.4 %   Platelets 89 (*) 150 - 400 K/uL     Assessment and Plan  Pelvic Pain in Pregnancy - Hx of Csection x 4 Vomiting of Blood  Plan: Admit for observation Dr. Clearance Coots to reeval in the AM  [facility] 12/09/2012, 3:14 AM

## 2012-12-09 NOTE — MAU Note (Signed)
Patient presents by EMS with contractions and pressure. Patient states she started vomiting dark red blood.

## 2012-12-09 NOTE — H&P (Signed)
Tracy Boyer is a 32 y.o. female presenting for N/V and pelvic pressure. Maternal Medical History:  Reason for admission: 46 y o G7 P32.  EDC 01-25-13.  Presents with pelvic pressure and vomiting blood.  H/O thrombocytopenia.  Fetal activity: Perceived fetal activity is normal.   Last perceived fetal movement was within the past hour.    Prenatal Complications - Diabetes: none.    OB History   Grav Para Term Preterm Abortions TAB SAB Ect Mult Living   7 6 3 3      6      Past Medical History  Diagnosis Date  . Allergy   . Anemia   . Anxiety   . History of abnormal Pap smear 1997  . HELLP syndrome 2002  . Pregnancy induced hypertension   . Preeclampsia 2004  . Thrombocytopenia    Past Surgical History  Procedure Laterality Date  . Cesarean section      x4  . Tooth extraction      x6   Family History: family history includes Hypertension in her mother. Social History:  reports that she has never smoked. She does not have any smokeless tobacco history on file. She reports that she does not drink alcohol or use illicit drugs.   Prenatal Transfer Tool  Maternal Diabetes: No Genetic Screening: Normal Maternal Ultrasounds/Referrals: Normal Fetal Ultrasounds or other Referrals:  None Maternal Substance Abuse:  No Significant Maternal Medications:  None Significant Maternal Lab Results:  Lab values include: Other:  CBC Other Comments:  None  Review of Systems  All other systems reviewed and are negative.    Dilation: Closed Exam by:: W. Jerolyn Center CNM Blood pressure 108/70, pulse 86, temperature 97.9 F (36.6 C), temperature source Oral, resp. rate 20, height 5\' 2"  (1.575 m), weight 147 lb (66.679 kg), last menstrual period 04/20/2012, SpO2 100.00%. Maternal Exam:  Abdomen: Patient reports no abdominal tenderness.   Physical Exam  Nursing note and vitals reviewed. Constitutional: She is oriented to person, place, and time. She appears well-developed and  well-nourished.  HENT:  Head: Normocephalic and atraumatic.  Eyes: Conjunctivae are normal. Pupils are equal, round, and reactive to light.  Neck: Normal range of motion. Neck supple.  Cardiovascular: Normal rate and regular rhythm.   Respiratory: Effort normal and breath sounds normal.  GI: Soft.  Genitourinary: Vagina normal and uterus normal.  Musculoskeletal: Normal range of motion.  Neurological: She is alert and oriented to person, place, and time.  Skin: Skin is warm and dry.  Psychiatric: She has a normal mood and affect. Her behavior is normal. Judgment and thought content normal.    Prenatal labs: ABO, Rh:   Antibody:   Rubella:   RPR:    HBsAg:    HIV:    GBS:     Assessment/Plan: 33 weeks.  N/V and pelvic pressure.  H/O thrombocytopenia.  Stable.  Admitted for supportive management.   HARPER,CHARLES A 12/09/2012, 5:38 AM

## 2012-12-09 NOTE — Discharge Summary (Signed)
Physician Discharge Summary  Patient ID: GRISELDA BRAMBLETT MRN: [id number] DOB/AGE: 1981/01/28 31 y.o.  Admit date: 12/09/2012 Discharge date: 12/09/2012  Admission Diagnoses: 33 weeks.  N/V and abdominal pain.  Discharge Diagnoses: Same, improved. Active Problems:   * No active hospital problems. *   Discharged Condition: good  Hospital Course: Admitted with N/V and abdominal.  Resolved overnight with supportive management.  Consults: None  Significant Diagnostic Studies: labs: CBC, CMET  Treatments: IV hydration and analgesia: acetaminophen w/ codeine  Discharge Exam: Blood pressure 100/63, pulse 73, temperature 98.6 F (37 C), temperature source Oral, resp. rate 20, height 5\' 2"  (1.575 m), weight 147 lb (66.679 kg), last menstrual period 04/20/2012, SpO2 100.00%. General appearance: alert and no distress GI: normal findings: soft, non-tender  Disposition: 07-Left Against Medical Advice  Discharge Orders   Future Orders Complete By Expires     Discharge activity:  As directed     Discharge diet:  As directed     Discharge instructions  As directed     Comments:      Routine    Do not have sex or do anything that might make you have an orgasm  As directed     Notify physician for a general feeling that "something is not right"  As directed     Notify physician for increase or change in vaginal discharge  As directed     Notify physician for intestinal cramps, with or without diarrhea, sometimes described as "gas pain"  As directed     Notify physician for leaking of fluid  As directed     Notify physician for low, dull backache, unrelieved by heat or Tylenol  As directed     Notify physician for menstrual like cramps  As directed     Notify physician for pelvic pressure  As directed     Notify physician for uterine contractions.  These may be painless and feel like the uterus is tightening or the baby is  "balling up"  As directed     Notify physician for vaginal  bleeding  As directed     PRETERM LABOR:  Includes any of the follwing symptoms that occur between 20 - [redacted] weeks gestation.  If these symptoms are not stopped, preterm labor can result in preterm delivery, placing your baby at risk  As directed         Medication List    TAKE these medications       acetaminophen-codeine 300-30 MG per tablet  Commonly known as:  TYLENOL #3  Take 1-2 tablets by mouth every 4 (four) hours as needed for pain.     ondansetron 4 MG disintegrating tablet  Commonly known as:  ZOFRAN-ODT  Take 1 tablet (4 mg total) by mouth every 8 (eight) hours as needed.     zolpidem 5 MG tablet  Commonly known as:  AMBIEN  Take 1 tablet (5 mg total) by mouth at bedtime as needed for sleep.           Follow-up Information   Schedule an appointment as soon as possible for a visit with Kathreen Cosier, MD.   Contact information:   [address] [place] [address] [phone]       Signed: HARPER,CHARLES A 12/09/2012, 2:48 PM

## 2012-12-25 ENCOUNTER — Other Ambulatory Visit: Payer: Self-pay | Admitting: Obstetrics

## 2012-12-26 ENCOUNTER — Inpatient Hospital Stay (HOSPITAL_COMMUNITY)
Admission: AD | Admit: 2012-12-26 | Discharge: 2012-12-27 | Disposition: A | Discharge status: Home / Self Care | Payer: Medicaid Other | Source: Ambulatory Visit | Attending: Obstetrics | Admitting: Obstetrics

## 2012-12-26 ENCOUNTER — Inpatient Hospital Stay (HOSPITAL_COMMUNITY): Payer: Medicaid Other

## 2012-12-26 ENCOUNTER — Encounter (HOSPITAL_COMMUNITY): Payer: Self-pay

## 2012-12-26 DIAGNOSIS — O99891 Other specified diseases and conditions complicating pregnancy: Secondary | ICD-10-CM | POA: Insufficient documentation

## 2012-12-26 DIAGNOSIS — O368390 Maternal care for abnormalities of the fetal heart rate or rhythm, unspecified trimester, not applicable or unspecified: Secondary | ICD-10-CM | POA: Insufficient documentation

## 2012-12-26 DIAGNOSIS — O4703 False labor before 37 completed weeks of gestation, third trimester: Secondary | ICD-10-CM

## 2012-12-26 DIAGNOSIS — Y92009 Unspecified place in unspecified non-institutional (private) residence as the place of occurrence of the external cause: Secondary | ICD-10-CM | POA: Insufficient documentation

## 2012-12-26 DIAGNOSIS — O9A213 Injury, poisoning and certain other consequences of external causes complicating pregnancy, third trimester: Secondary | ICD-10-CM

## 2012-12-26 DIAGNOSIS — S79911A Unspecified injury of right hip, initial encounter: Secondary | ICD-10-CM

## 2012-12-26 DIAGNOSIS — R109 Unspecified abdominal pain: Secondary | ICD-10-CM | POA: Insufficient documentation

## 2012-12-26 DIAGNOSIS — O4700 False labor before 37 completed weeks of gestation, unspecified trimester: Secondary | ICD-10-CM | POA: Insufficient documentation

## 2012-12-26 DIAGNOSIS — M25559 Pain in unspecified hip: Secondary | ICD-10-CM | POA: Insufficient documentation

## 2012-12-26 LAB — CBC
HCT: 34.5 % — ABNORMAL LOW (ref 36.0–46.0)
Hemoglobin: 11.7 g/dL — ABNORMAL LOW (ref 12.0–15.0)
MCH: 27.9 pg (ref 26.0–34.0)
MCV: 82.3 fL (ref 78.0–100.0)
RBC: 4.19 MIL/uL (ref 3.87–5.11)

## 2012-12-26 MED ORDER — TERBUTALINE SULFATE 1 MG/ML IJ SOLN
0.2500 mg | INTRAMUSCULAR | Status: DC | PRN
  Administered 2012-12-27: 0.25 mg via SUBCUTANEOUS
  Filled 2012-12-26: qty 1

## 2012-12-26 MED ORDER — BUTORPHANOL TARTRATE 1 MG/ML IJ SOLN
1.0000 mg | INTRAMUSCULAR | Status: DC | PRN
  Administered 2012-12-27: 1 mg via INTRAVENOUS
  Filled 2012-12-26: qty 1

## 2012-12-26 MED ORDER — LACTATED RINGERS IV BOLUS (SEPSIS)
1000.0000 mL | Freq: Once | INTRAVENOUS | Status: AC
  Administered 2012-12-26: 1000 mL via INTRAVENOUS
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
## 2012-12-27 DIAGNOSIS — W010XXA Fall on same level from slipping, tripping and stumbling without subsequent striking against object, initial encounter: Secondary | ICD-10-CM

## 2012-12-27 DIAGNOSIS — M25559 Pain in unspecified hip: Secondary | ICD-10-CM

## 2012-12-27 LAB — TYPE AND SCREEN: ABO/RH(D): B POS

## 2012-12-27 LAB — RPR: RPR Ser Ql: NONREACTIVE

## 2012-12-27 MED ORDER — ACETAMINOPHEN-CODEINE #3 300-30 MG PO TABS
2.0000 | ORAL_TABLET | ORAL | Status: DC | PRN
  Administered 2012-12-27: 2 via ORAL
  Filled 2012-12-27: qty 2

## 2012-12-27 MED ORDER — ACETAMINOPHEN-CODEINE #3 300-30 MG PO TABS: 1.0000 | ORAL_TABLET | ORAL | Status: DC | PRN

## 2012-12-29 LAB — OB RESULTS CONSOLE HIV ANTIBODY (ROUTINE TESTING): HIV: NONREACTIVE

## 2012-12-29 LAB — OB RESULTS CONSOLE HEPATITIS B SURFACE ANTIGEN: Hepatitis B Surface Ag: NEGATIVE

## 2012-12-29 LAB — OB RESULTS CONSOLE RUBELLA ANTIBODY, IGM: Rubella: IMMUNE

## 2013-01-06 ENCOUNTER — Encounter (HOSPITAL_COMMUNITY): Payer: Self-pay | Admitting: Pharmacy Technician

## 2013-01-12 ENCOUNTER — Encounter (HOSPITAL_COMMUNITY)
Admission: AD | Disposition: A | Discharge status: Home / Self Care | Payer: Self-pay | Source: Ambulatory Visit | Attending: Obstetrics

## 2013-01-12 ENCOUNTER — Inpatient Hospital Stay (HOSPITAL_COMMUNITY): Payer: Medicaid Other

## 2013-01-12 ENCOUNTER — Inpatient Hospital Stay (HOSPITAL_COMMUNITY)
Admission: AD | Admit: 2013-01-12 | Discharge: 2013-01-15 | DRG: 766 | Disposition: A | Discharge status: Home / Self Care | Payer: Medicaid Other | Source: Ambulatory Visit | Attending: Obstetrics | Admitting: Obstetrics

## 2013-01-12 ENCOUNTER — Encounter (HOSPITAL_COMMUNITY): Payer: Self-pay

## 2013-01-12 ENCOUNTER — Encounter (HOSPITAL_COMMUNITY): Payer: Self-pay | Admitting: *Deleted

## 2013-01-12 DIAGNOSIS — O9279 Other disorders of lactation: Secondary | ICD-10-CM | POA: Diagnosis not present

## 2013-01-12 DIAGNOSIS — Z302 Encounter for sterilization: Secondary | ICD-10-CM

## 2013-01-12 DIAGNOSIS — O34219 Maternal care for unspecified type scar from previous cesarean delivery: Principal | ICD-10-CM | POA: Diagnosis present

## 2013-01-12 LAB — CORTISOL-AM, BLOOD: Cortisol - AM: 21.5 ug/dL (ref 4.3–22.4)

## 2013-01-12 SURGERY — Surgical Case
Anesthesia: Spinal | Site: Abdomen | Wound class: Clean Contaminated

## 2013-01-12 MED ORDER — LACTATED RINGERS IV BOLUS (SEPSIS)
1000.0000 mL | Freq: Once | INTRAVENOUS | Status: AC
  Administered 2013-01-12: 1000 mL via INTRAVENOUS

## 2013-01-12 MED ORDER — MORPHINE SULFATE (PF) 0.5 MG/ML IJ SOLN
INTRAMUSCULAR | Status: DC | PRN
  Administered 2013-01-12: 1 mg via INTRAVENOUS
  Administered 2013-01-12: .9 mg via INTRAVENOUS

## 2013-01-12 MED ORDER — SODIUM CHLORIDE 0.9 % IR SOLN
Status: DC | PRN
  Administered 2013-01-12: 1000 mL

## 2013-01-12 MED ORDER — OXYTOCIN 40 UNITS IN LACTATED RINGERS INFUSION - SIMPLE MED: 62.5000 mL/h | INTRAVENOUS | Status: AC

## 2013-01-12 MED ORDER — FENTANYL CITRATE 0.05 MG/ML IJ SOLN
INTRAMUSCULAR | Status: DC | PRN
  Administered 2013-01-12: 12.5 ug via INTRAVENOUS
  Administered 2013-01-12: 10 ug via INTRAVENOUS
  Administered 2013-01-12 (×2): 12.5 ug via INTRAVENOUS
  Administered 2013-01-12: 15 ug via INTRATHECAL
  Administered 2013-01-12 (×3): 12.5 ug via INTRAVENOUS

## 2013-01-12 MED ORDER — ACETAMINOPHEN 10 MG/ML IV SOLN: 1000.0000 mg | Freq: Once | INTRAVENOUS | Status: AC | PRN

## 2013-01-12 MED ORDER — NALBUPHINE HCL 10 MG/ML IJ SOLN
5.0000 mg | INTRAMUSCULAR | Status: DC | PRN
  Filled 2013-01-12: qty 1

## 2013-01-12 MED ORDER — SENNOSIDES-DOCUSATE SODIUM 8.6-50 MG PO TABS
2.0000 | ORAL_TABLET | Freq: Every day | ORAL | Status: DC
  Administered 2013-01-12 – 2013-01-14 (×3): 2 via ORAL

## 2013-01-12 MED ORDER — SCOPOLAMINE 1 MG/3DAYS TD PT72
MEDICATED_PATCH | TRANSDERMAL | Status: AC
  Filled 2013-01-12: qty 1

## 2013-01-12 MED ORDER — EPHEDRINE SULFATE 50 MG/ML IJ SOLN
INTRAMUSCULAR | Status: DC | PRN
  Administered 2013-01-12: 20 mg via INTRAVENOUS
  Administered 2013-01-12: 10 mg via INTRAVENOUS
  Administered 2013-01-12: 15 mg via INTRAVENOUS
  Administered 2013-01-12: 25 mg via INTRAVENOUS
  Administered 2013-01-12: 10 mg via INTRAVENOUS

## 2013-01-12 MED ORDER — FAMOTIDINE IN NACL 20-0.9 MG/50ML-% IV SOLN
20.0000 mg | Freq: Once | INTRAVENOUS | Status: AC
  Administered 2013-01-12: 20 mg via INTRAVENOUS
  Filled 2013-01-12: qty 50

## 2013-01-12 MED ORDER — EPHEDRINE 5 MG/ML INJ
INTRAVENOUS | Status: AC
  Filled 2013-01-12: qty 10

## 2013-01-12 MED ORDER — NALOXONE HCL 0.4 MG/ML IJ SOLN: 0.4000 mg | INTRAMUSCULAR | Status: DC | PRN

## 2013-01-12 MED ORDER — PHENYLEPHRINE HCL 10 MG/ML IJ SOLN
INTRAMUSCULAR | Status: DC | PRN
  Administered 2013-01-12 (×4): 40 ug via INTRAVENOUS

## 2013-01-12 MED ORDER — MEPERIDINE HCL 25 MG/ML IJ SOLN: 6.2500 mg | INTRAMUSCULAR | Status: DC | PRN

## 2013-01-12 MED ORDER — ONDANSETRON HCL 4 MG PO TABS: 4.0000 mg | ORAL_TABLET | ORAL | Status: DC | PRN

## 2013-01-12 MED ORDER — DEXTROSE 5 % IV SOLN
500.0000 mg | Freq: Once | INTRAVENOUS | Status: DC
  Filled 2013-01-12: qty 500

## 2013-01-12 MED ORDER — ONDANSETRON HCL 4 MG/2ML IJ SOLN
INTRAMUSCULAR | Status: AC
  Filled 2013-01-12: qty 2

## 2013-01-12 MED ORDER — LANOLIN HYDROUS EX OINT: 1.0000 "application " | TOPICAL_OINTMENT | CUTANEOUS | Status: DC | PRN

## 2013-01-12 MED ORDER — CITRIC ACID-SODIUM CITRATE 334-500 MG/5ML PO SOLN
30.0000 mL | Freq: Once | ORAL | Status: AC
  Administered 2013-01-12: 30 mL via ORAL
  Filled 2013-01-12: qty 15

## 2013-01-12 MED ORDER — PROMETHAZINE HCL 25 MG/ML IJ SOLN
INTRAMUSCULAR | Status: AC
  Administered 2013-01-12: 6.25 mg via INTRAVENOUS
  Filled 2013-01-12: qty 1

## 2013-01-12 MED ORDER — ACETAMINOPHEN 10 MG/ML IV SOLN
INTRAVENOUS | Status: AC
  Administered 2013-01-12: 1000 mg via INTRAVENOUS
  Filled 2013-01-12: qty 100

## 2013-01-12 MED ORDER — OXYTOCIN 10 UNIT/ML IJ SOLN
40.0000 [IU] | INTRAVENOUS | Status: DC | PRN
  Administered 2013-01-12: 40 [IU] via INTRAVENOUS

## 2013-01-12 MED ORDER — MORPHINE SULFATE 0.5 MG/ML IJ SOLN
INTRAMUSCULAR | Status: AC
  Filled 2013-01-12: qty 10

## 2013-01-12 MED ORDER — SODIUM CHLORIDE 0.9 % IJ SOLN: 3.0000 mL | INTRAMUSCULAR | Status: DC | PRN

## 2013-01-12 MED ORDER — DIPHENHYDRAMINE HCL 50 MG/ML IJ SOLN
12.5000 mg | INTRAMUSCULAR | Status: DC | PRN
  Administered 2013-01-12: 12.5 mg via INTRAVENOUS
  Filled 2013-01-12: qty 1

## 2013-01-12 MED ORDER — ONDANSETRON HCL 4 MG/2ML IJ SOLN: 4.0000 mg | INTRAMUSCULAR | Status: DC | PRN

## 2013-01-12 MED ORDER — KETOROLAC TROMETHAMINE 60 MG/2ML IM SOLN: 60.0000 mg | Freq: Once | INTRAMUSCULAR | Status: DC | PRN

## 2013-01-12 MED ORDER — TETANUS-DIPHTH-ACELL PERTUSSIS 5-2.5-18.5 LF-MCG/0.5 IM SUSP
0.5000 mL | Freq: Once | INTRAMUSCULAR | Status: AC
Start: 2013-01-13 — End: 2013-01-13
  Administered 2013-01-13: 0.5 mL via INTRAMUSCULAR
  Filled 2013-01-12: qty 0.5

## 2013-01-12 MED ORDER — MORPHINE SULFATE (PF) 0.5 MG/ML IJ SOLN
INTRAMUSCULAR | Status: DC | PRN
  Administered 2013-01-12: 100 ug via EPIDURAL

## 2013-01-12 MED ORDER — AZITHROMYCIN 500 MG IV SOLR
INTRAVENOUS | Status: DC | PRN
  Administered 2013-01-12: 500 mg via BUCCAL

## 2013-01-12 MED ORDER — TRANEXAMIC ACID 100 MG/ML IV SOLN
1000.0000 mg | INTRAVENOUS | Status: DC
  Filled 2013-01-12: qty 10

## 2013-01-12 MED ORDER — MEASLES, MUMPS & RUBELLA VAC ~~LOC~~ INJ
0.5000 mL | INJECTION | Freq: Once | SUBCUTANEOUS | Status: DC
  Filled 2013-01-12: qty 0.5

## 2013-01-12 MED ORDER — METOCLOPRAMIDE HCL 5 MG/ML IJ SOLN: 10.0000 mg | Freq: Three times a day (TID) | INTRAMUSCULAR | Status: DC | PRN

## 2013-01-12 MED ORDER — ZOLPIDEM TARTRATE 5 MG PO TABS: 5.0000 mg | ORAL_TABLET | Freq: Every evening | ORAL | Status: DC | PRN

## 2013-01-12 MED ORDER — SCOPOLAMINE 1 MG/3DAYS TD PT72
1.0000 | MEDICATED_PATCH | Freq: Once | TRANSDERMAL | Status: AC
  Administered 2013-01-12: 1.5 mg via TRANSDERMAL

## 2013-01-12 MED ORDER — HYDROMORPHONE HCL PF 1 MG/ML IJ SOLN
INTRAMUSCULAR | Status: AC
  Administered 2013-01-12: 0.25 mg via INTRAVENOUS
  Filled 2013-01-12: qty 1

## 2013-01-12 MED ORDER — CEFAZOLIN SODIUM-DEXTROSE 2-3 GM-% IV SOLR: 2.0000 g | INTRAVENOUS | Status: DC

## 2013-01-12 MED ORDER — OXYTOCIN 10 UNIT/ML IJ SOLN
INTRAMUSCULAR | Status: AC
  Filled 2013-01-12: qty 4

## 2013-01-12 MED ORDER — CHLOROPROCAINE HCL 3 % IJ SOLN
INTRAMUSCULAR | Status: AC
  Filled 2013-01-12: qty 20

## 2013-01-12 MED ORDER — PRENATAL MULTIVITAMIN CH
1.0000 | ORAL_TABLET | Freq: Every day | ORAL | Status: DC
  Administered 2013-01-13 – 2013-01-14 (×2): 1 via ORAL
  Filled 2013-01-12 (×2): qty 1

## 2013-01-12 MED ORDER — KETOROLAC TROMETHAMINE 30 MG/ML IJ SOLN: 30.0000 mg | Freq: Four times a day (QID) | INTRAMUSCULAR | Status: DC | PRN

## 2013-01-12 MED ORDER — DIPHENHYDRAMINE HCL 25 MG PO CAPS: 25.0000 mg | ORAL_CAPSULE | ORAL | Status: DC | PRN

## 2013-01-12 MED ORDER — DIBUCAINE 1 % RE OINT: 1.0000 "application " | TOPICAL_OINTMENT | RECTAL | Status: DC | PRN

## 2013-01-12 MED ORDER — HYDROMORPHONE HCL PF 1 MG/ML IJ SOLN
0.2500 mg | INTRAMUSCULAR | Status: DC | PRN
  Administered 2013-01-12: 0.25 mg via INTRAVENOUS

## 2013-01-12 MED ORDER — SIMETHICONE 80 MG PO CHEW
80.0000 mg | CHEWABLE_TABLET | ORAL | Status: DC | PRN
  Administered 2013-01-12 – 2013-01-13 (×3): 80 mg via ORAL

## 2013-01-12 MED ORDER — PHENYLEPHRINE 40 MCG/ML (10ML) SYRINGE FOR IV PUSH (FOR BLOOD PRESSURE SUPPORT)
PREFILLED_SYRINGE | INTRAVENOUS | Status: AC
  Filled 2013-01-12: qty 5

## 2013-01-12 MED ORDER — PROMETHAZINE HCL 25 MG/ML IJ SOLN: 6.2500 mg | INTRAMUSCULAR | Status: DC | PRN

## 2013-01-12 MED ORDER — DIPHENHYDRAMINE HCL 25 MG PO CAPS: 25.0000 mg | ORAL_CAPSULE | Freq: Four times a day (QID) | ORAL | Status: DC | PRN

## 2013-01-12 MED ORDER — DIPHENHYDRAMINE HCL 50 MG/ML IJ SOLN: 25.0000 mg | INTRAMUSCULAR | Status: DC | PRN

## 2013-01-12 MED ORDER — TRANEXAMIC ACID 100 MG/ML IV SOLN
1000.0000 mg | INTRAVENOUS | Status: DC | PRN
  Administered 2013-01-12: 1000 mg via INTRAVENOUS

## 2013-01-12 MED ORDER — NALOXONE HCL 1 MG/ML IJ SOLN
1.0000 ug/kg/h | INTRAVENOUS | Status: DC | PRN
  Filled 2013-01-12: qty 2

## 2013-01-12 MED ORDER — BUTORPHANOL TARTRATE 1 MG/ML IJ SOLN
1.0000 mg | Freq: Once | INTRAMUSCULAR | Status: AC
  Administered 2013-01-12: 1 mg via INTRAVENOUS
  Filled 2013-01-12: qty 1

## 2013-01-12 MED ORDER — SODIUM CHLORIDE 0.9 % IV SOLN
1000.0000 mg | INTRAVENOUS | Status: DC
  Filled 2013-01-12: qty 10

## 2013-01-12 MED ORDER — ONDANSETRON HCL 4 MG/2ML IJ SOLN
INTRAMUSCULAR | Status: DC | PRN
  Administered 2013-01-12: 4 mg via INTRAVENOUS

## 2013-01-12 MED ORDER — FENTANYL CITRATE 0.05 MG/ML IJ SOLN
INTRAMUSCULAR | Status: AC
  Filled 2013-01-12: qty 2

## 2013-01-12 MED ORDER — LACTATED RINGERS IV SOLN
INTRAVENOUS | Status: DC | PRN
  Administered 2013-01-12 (×3): via INTRAVENOUS

## 2013-01-12 MED ORDER — CEFAZOLIN SODIUM-DEXTROSE 2-3 GM-% IV SOLR
INTRAVENOUS | Status: AC
  Administered 2013-01-12: 2 g via INTRAVENOUS
  Filled 2013-01-12: qty 50

## 2013-01-12 MED ORDER — BUPIVACAINE IN DEXTROSE 0.75-8.25 % IT SOLN
INTRATHECAL | Status: DC | PRN
  Administered 2013-01-12: 1.5 mL via INTRATHECAL

## 2013-01-12 MED ORDER — FERROUS SULFATE 325 (65 FE) MG PO TABS
325.0000 mg | ORAL_TABLET | Freq: Two times a day (BID) | ORAL | Status: DC
  Administered 2013-01-13 – 2013-01-14 (×4): 325 mg via ORAL
  Filled 2013-01-12 (×4): qty 1

## 2013-01-12 MED ORDER — IBUPROFEN 600 MG PO TABS
600.0000 mg | ORAL_TABLET | Freq: Four times a day (QID) | ORAL | Status: DC
  Administered 2013-01-14 (×2): 600 mg via ORAL
  Filled 2013-01-12 (×2): qty 1

## 2013-01-12 MED ORDER — ONDANSETRON HCL 4 MG/2ML IJ SOLN: 4.0000 mg | Freq: Three times a day (TID) | INTRAMUSCULAR | Status: DC | PRN

## 2013-01-12 MED ORDER — LACTATED RINGERS IV SOLN
INTRAVENOUS | Status: DC
  Administered 2013-01-12: 16:00:00 via INTRAVENOUS

## 2013-01-12 MED ORDER — MAGNESIUM HYDROXIDE 400 MG/5ML PO SUSP: 30.0000 mL | ORAL | Status: DC | PRN

## 2013-01-12 MED ORDER — WITCH HAZEL-GLYCERIN EX PADS: 1.0000 "application " | MEDICATED_PAD | CUTANEOUS | Status: DC | PRN

## 2013-01-12 MED ORDER — OXYCODONE-ACETAMINOPHEN 5-325 MG PO TABS
1.0000 | ORAL_TABLET | ORAL | Status: DC | PRN
  Administered 2013-01-12 – 2013-01-14 (×11): 2 via ORAL
  Administered 2013-01-15: 1 via ORAL
  Administered 2013-01-15: 2 via ORAL
  Filled 2013-01-12 (×5): qty 2
  Filled 2013-01-12: qty 1
  Filled 2013-01-12 (×8): qty 2
  Filled 2013-01-12: qty 1

## 2013-01-12 SURGICAL SUPPLY — 44 items
APL SKNCLS STERI-STRIP NONHPOA (GAUZE/BANDAGES/DRESSINGS) ×1
BENZOIN TINCTURE PRP APPL 2/3 (GAUZE/BANDAGES/DRESSINGS) ×2 IMPLANT
BRR ADH 6X5 SEPRAFILM 1 SHT (MISCELLANEOUS) ×1
CANISTER WOUND CARE 500ML ATS (WOUND CARE) IMPLANT
CLAMP CORD UMBIL (MISCELLANEOUS) IMPLANT
CLOTH BEACON ORANGE TIMEOUT ST (SAFETY) ×2 IMPLANT
CONTAINER PREFILL 10% NBF 15ML (MISCELLANEOUS) ×2 IMPLANT
DRAPE LG THREE QUARTER DISP (DRAPES) ×2 IMPLANT
DRSG OPSITE 11X17.75 LRG (GAUZE/BANDAGES/DRESSINGS) ×1 IMPLANT
DRSG OPSITE POSTOP 4X10 (GAUZE/BANDAGES/DRESSINGS) ×2 IMPLANT
DRSG VAC ATS LRG SENSATRAC (GAUZE/BANDAGES/DRESSINGS) IMPLANT
DRSG VAC ATS MED SENSATRAC (GAUZE/BANDAGES/DRESSINGS) IMPLANT
DRSG VAC ATS SM SENSATRAC (GAUZE/BANDAGES/DRESSINGS) IMPLANT
DURAPREP 26ML APPLICATOR (WOUND CARE) ×2 IMPLANT
ELECT REM PT RETURN 9FT ADLT (ELECTROSURGICAL) ×2
ELECTRODE REM PT RTRN 9FT ADLT (ELECTROSURGICAL) ×1 IMPLANT
EXTRACTOR VACUUM M CUP 4 TUBE (SUCTIONS) IMPLANT
GLOVE BIO SURGEON STRL SZ 6.5 (GLOVE) ×3 IMPLANT
GOWN STRL REIN XL XLG (GOWN DISPOSABLE) ×4 IMPLANT
KIT ABG SYR 3ML LUER SLIP (SYRINGE) IMPLANT
NDL HYPO 25X5/8 SAFETYGLIDE (NEEDLE) ×1 IMPLANT
NEEDLE HYPO 25X5/8 SAFETYGLIDE (NEEDLE) IMPLANT
NS IRRIG 1000ML POUR BTL (IV SOLUTION) ×2 IMPLANT
PACK C SECTION WH (CUSTOM PROCEDURE TRAY) ×2 IMPLANT
PAD OB MATERNITY 4.3X12.25 (PERSONAL CARE ITEMS) ×2 IMPLANT
RTRCTR C-SECT PINK 25CM LRG (MISCELLANEOUS) IMPLANT
SCRUB PCMX 4 OZ (MISCELLANEOUS) ×2 IMPLANT
SEPRAFILM MEMBRANE 5X6 (MISCELLANEOUS) ×1 IMPLANT
STAPLER VISISTAT 35W (STAPLE) IMPLANT
STRIP CLOSURE SKIN 1/2X4 (GAUZE/BANDAGES/DRESSINGS) ×2 IMPLANT
SUT MNCRL 0 VIOLET CTX 36 (SUTURE) ×2 IMPLANT
SUT MNCRL AB 3-0 PS2 27 (SUTURE) ×1 IMPLANT
SUT MONOCRYL 0 CTX 36 (SUTURE) ×2
SUT PDS AB 0 CTX 36 PDP370T (SUTURE) ×3 IMPLANT
SUT PLAIN 0 NONE (SUTURE) ×1 IMPLANT
SUT VIC AB 0 CTXB 36 (SUTURE) IMPLANT
SUT VIC AB 2-0 CT1 (SUTURE) ×4 IMPLANT
SUT VIC AB 2-0 CT1 27 (SUTURE) ×2
SUT VIC AB 2-0 CT1 TAPERPNT 27 (SUTURE) ×1 IMPLANT
SUT VIC AB 2-0 SH 27 (SUTURE)
SUT VIC AB 2-0 SH 27XBRD (SUTURE) IMPLANT
TOWEL OR 17X24 6PK STRL BLUE (TOWEL DISPOSABLE) ×6 IMPLANT
TRAY FOLEY CATH 14FR (SET/KITS/TRAYS/PACK) ×2 IMPLANT
WATER STERILE IRR 1000ML POUR (IV SOLUTION) ×2 IMPLANT

## 2013-01-12 NOTE — Anesthesia Postprocedure Evaluation (Signed)
Anesthesia Post Note  Patient: Tracy Boyer  Procedure(s) Performed: Procedure(s) (LRB): Repeat cesarean section with delivery of baby boy at 54. Apgars 8/9. Bilateral tubal ligation. (N/A)  Anesthesia type: Spinal  Patient location: PACU  Post pain: Pain level controlled  Post assessment: Post-op Vital signs reviewed  Last Vitals: BP 113/70  Pulse 71  Temp(Src) 36.3 C (Oral)  Resp 16  Wt 152 lb (68.947 kg)  BMI 27.79 kg/m2  SpO2 100%  LMP 04/20/2012  Post vital signs: Reviewed  Level of consciousness: sedated  Complications: No apparent anesthesia complications

## 2013-01-12 NOTE — Progress Notes (Signed)
Pt. Unable to take Motrin or Toradol d/t platelet count.  Requesting medicine at report.  Anesthesia notified, ok to begin giving Percocet.

## 2013-01-12 NOTE — Progress Notes (Signed)
Dr Tamela Oddi notified of patient's arrival, contraction pattern, VE, FHR pattern. No orders received at this time.

## 2013-01-12 NOTE — OR Nursing (Addendum)
Uterus massaged by S. Atia Haupt Charity fundraiser. Two tubes of cord blood sent to lab.  25cc of blood evacuated from uterus during uterine massage.

## 2013-01-12 NOTE — Progress Notes (Signed)
Pt requesting pain medications, Dr Tamela Oddi notified. Orders received

## 2013-01-12 NOTE — H&P (Signed)
Tracy Boyer is a 32 y.o. female presenting for rule out labor. Maternal Medical History:  Reason for admission: Contractions.   Contractions: Onset was 6-12 hours ago.   Frequency: regular.   Perceived severity is strong.    Fetal activity: Perceived fetal activity is normal.    Prenatal complications: Thrombocytopenia.   Prenatal Complications - Diabetes: none.    OB History   Grav Para Term Preterm Abortions TAB SAB Ect Mult Living   7 6 3 3      6      Past Medical History  Diagnosis Date  . Allergy   . Anemia   . Anxiety   . History of abnormal Pap smear 1997  . HELLP syndrome 2002  . Pregnancy induced hypertension   . Preeclampsia 2004  . Thrombocytopenia    Past Surgical History  Procedure Laterality Date  . Cesarean section      x4  . Tooth extraction      x6   Family History: family history includes Hypertension in her mother. Social History:  reports that she has never smoked. She does not have any smokeless tobacco history on file. She reports that she does not drink alcohol or use illicit drugs.     Review of Systems  Constitutional: Negative for fever.  Eyes: Negative for blurred vision.  Respiratory: Negative for shortness of breath.   Gastrointestinal: Negative for vomiting.  Skin: Negative for rash.  Neurological: Negative for headaches.    Dilation: 1 Effacement (%): 100 Station: Ballotable Exam by:: G Morris RN Blood pressure 116/83, pulse 83, temperature 97.8 F (36.6 C), temperature source Oral, resp. rate 18, last menstrual period 04/20/2012. Maternal Exam:  Uterine Assessment: Contraction frequency is regular.   Abdomen: Patient reports no abdominal tenderness. Fetal presentation: vertex  Introitus: not evaluated.   Cervix: Cervix evaluated by digital exam.     Fetal Exam Fetal Monitor Review: Variability: moderate (6-25 bpm).   Pattern: accelerations present and no decelerations.    Fetal State Assessment: Category I -  tracings are normal.     Physical Exam  Constitutional: She appears well-developed.  HENT:  Head: Normocephalic.  Neck: Neck supple. No thyromegaly present.  Cardiovascular: Normal rate and regular rhythm.   Respiratory: Breath sounds normal.  GI: Soft. Bowel sounds are normal.  Skin: No rash noted.    Prenatal labs: ABO, Rh: --/--/B POS (06/11 2329) Antibody: NEG (06/11 2329) Rubella:   RPR: NON REACTIVE (06/11 2329)  HBsAg:    HIV:    GBS:     Assessment/Plan: Multipara @ [redacted]w[redacted]d.  Early labor.  H/O multiple cesarean deliveries.  Pregnancy complicated by gestational thrombocytopenia- mild-moderate Steroid use--started less than 3 weeks ago  Check cortisol level, plt count T&S Tranexamic acid on call to OR Repeat C/D, BTL   JACKSON-MOORE,Amous Crewe A 01/12/2013, 8:50 AM

## 2013-01-12 NOTE — Anesthesia Procedure Notes (Addendum)
Spinal  Patient location during procedure: OR Start time: 01/12/2013 9:30 AM End time: 01/12/2013 9:35 AM Staffing Anesthesiologist: Lewie Loron R Performed by: anesthesiologist  Preanesthetic Checklist Completed: patient identified, site marked, surgical consent, pre-op evaluation, timeout performed, IV checked, risks and benefits discussed and monitors and equipment checked Spinal Block Patient position: sitting Prep: DuraPrep Patient monitoring: heart rate, continuous pulse ox and blood pressure Approach: midline Location: L3-4 Injection technique: single-shot Needle Needle type: Sprotte  Needle gauge: 24 G Needle length: 9 cm Assessment Sensory level: T6 Events: paresthesia Additional Notes Expiration date of kit checked and confirmed. Patient tolerated procedure well, without complications.

## 2013-01-12 NOTE — MAU Note (Signed)
Pt presents with complaints of contractions that started at 3 am. Denies any bleeding or LOF. States baby is active

## 2013-01-12 NOTE — Op Note (Signed)
Cesarean Section Procedure Note   Tracy Boyer   01/12/2013  Indications: History of previous C/D x 4, early labor   Pre-operative Diagnosis: previous cesarean section x 4/early labor/desires sterilization.   Post-operative Diagnosis: Same   Surgeon: Antionette Char A  Assistants: none  Anesthesia: spinal  Procedure Details:  The patient was seen in the Holding Room. The risks, benefits, complications, treatment options, and expected outcomes were discussed with the patient. The patient concurred with the proposed plan, giving informed consent. The patient was identified as Tracy Boyer and the procedure verified as C-Section Delivery. A Time Out was held and the above information confirmed.  After induction of anesthesia, the patient was draped and prepped in the usual sterile manner. A transverse incision was made and the previous scar was excised. The incision was carried down through the subcutaneous tissue to the fascia. The fascial incision was made and extended transversely. The fascia was separated from the underlying rectus tissue superiorly. The peritoneum was identified and entered. The peritoneal incision was extended. The utero-vesical peritoneal reflection was incised transversely and the bladder flap was sharply freed from the lower uterine segment. A low transverse uterine incision was made. Delivered from cephalic presentation was  living newborn female infant. APGAR (1 MIN): 8   APGAR (5 MINS): 9     A cord ph was not sent. The umbilical cord was clamped and cut cord. A sample was obtained for evaluation. The placenta was removed Intact and appeared normal.  The uterine incision was closed with running locked sutures of 1-0 Monocryl. There is bleeding noted from the bladder pillar. This was secured with figure-of-eight suture. There were omental adhesions to the uterine serosa. These were divided with cautery. Hemostasis was observed. The left fallopian tube was  identified. The mid isthmic portion of the tube was then grasped with a Babcock clamp. A 1-2 cm segment of tube was then doubly ligated with 0 plain and excised. Adequate hemostasis was noted. The right fallopian tube was was manipulated in a similar fashion Sepra film was placed over the uterine serosa. The parieto peritoneum was closed in a running fashion with 2-0 Vicryl.  The fascia was then reapproximated with running sutures of 0 PDS.  The skin was closed with suture.  Instrument, sponge, and needle counts were correct prior the abdominal closure and were correct at the conclusion of the case.    Findings: The rectus muscles were splayed. The lower uterine segment was very thin and attenuated. There were omental adhesions to the uterine serosa.   Estimated Blood Loss: 600 mL   Total IV Fluids: Per anesthesiology  Urine Output: Per anesthesiology  Specimens: Portions of fallopian tubes.   Complications: no complications  Disposition: PACU - hemodynamically stable.  Maternal Condition: stable   Baby condition / location:  nursery-stable    Signed: Surgeon(s): Antionette Char, MD

## 2013-01-12 NOTE — Anesthesia Preprocedure Evaluation (Signed)
Anesthesia Evaluation  Patient identified by MRN, date of birth, ID band Patient awake    Reviewed: Allergy & Precautions, H&P , NPO status , Patient's Chart, lab work & pertinent test results  Airway Mallampati: II TM Distance: >3 FB Neck ROM: Full    Dental  (+) Dental Advisory Given   Pulmonary neg pulmonary ROS,  breath sounds clear to auscultation        Cardiovascular hypertension, Pt. on medications Rhythm:Regular Rate:Normal     Neuro/Psych  Headaches, PSYCHIATRIC DISORDERS Anxiety    GI/Hepatic negative GI ROS, Neg liver ROS,   Endo/Other  negative endocrine ROS  Renal/GU negative Renal ROS     Musculoskeletal negative musculoskeletal ROS (+)   Abdominal   Peds  Hematology negative hematology ROS (+)   Anesthesia Other Findings   Reproductive/Obstetrics (+) Pregnancy                           Anesthesia Physical Anesthesia Plan  ASA: II  Anesthesia Plan: Spinal   Post-op Pain Management:    Induction:   Airway Management Planned:   Additional Equipment:   Intra-op Plan:   Post-operative Plan:   Informed Consent: I have reviewed the patients History and Physical, chart, labs and discussed the procedure including the risks, benefits and alternatives for the proposed anesthesia with the patient or authorized representative who has indicated his/her understanding and acceptance.   Dental advisory given  Plan Discussed with: CRNA  Anesthesia Plan Comments:         Anesthesia Quick Evaluation

## 2013-01-12 NOTE — Transfer of Care (Signed)
Immediate Anesthesia Transfer of Care Note  Patient: Tracy Boyer  Procedure(s) Performed: Procedure(s): Repeat cesarean section with delivery of baby boy at 60. Apgars 8/9. Bilateral tubal ligation. (N/A)  Patient Location: PACU  Anesthesia Type:Spinal  Level of Consciousness: awake, alert  and oriented  Airway & Oxygen Therapy: Patient Spontanous Breathing  Post-op Assessment: Report given to PACU RN and Post -op Vital signs reviewed and stable  Post vital signs: Reviewed and stable  Complications: No apparent anesthesia complications

## 2013-01-13 ENCOUNTER — Encounter (HOSPITAL_COMMUNITY): Payer: Self-pay | Admitting: Anesthesiology

## 2013-01-13 LAB — CBC
HCT: 31.4 % — ABNORMAL LOW (ref 36.0–46.0)
MCV: 83.3 fL (ref 78.0–100.0)
RBC: 3.77 MIL/uL — ABNORMAL LOW (ref 3.87–5.11)
RDW: 14 % (ref 11.5–15.5)
WBC: 9.3 10*3/uL (ref 4.0–10.5)

## 2013-01-13 NOTE — Progress Notes (Signed)
Patient ID: Tracy Boyer, female   DOB: 06-Apr-1981, 32 y.o.   MRN: [id number] Subjective: POD# 1 s/p Cesarean Delivery.  Indications: early labor; h/o previous C/D  RH status/Rubella reviewed. Feeding: unknown Patient reports tolerating PO.  Denies HA/SOB/C/P/N/V/dizziness.  Breast symptoms: no.  She reports vaginal bleeding as normal, without clots.  She is ambulating, urinating without difficulty.     Objective: Vital signs in last 24 hours: BP 109/66  Pulse 85  Temp(Src) 98.2 F (36.8 C) (Oral)  Resp 18  Wt 152 lb (68.947 kg)  BMI 27.79 kg/m2  SpO2 97%  LMP 04/20/2012       Physical Exam:  General: alert CV: Regular rate and rhythm Resp: clear Abdomen: soft, nontender, normal bowel sounds Lochia: minimal Uterine Fundus: firm, below umbilicus, tender Incision: clean, dry and intact Ext: extremities normal, atraumatic, no cyanosis or edema    Recent Labs  01/13/13 0545  HGB 10.4*  HCT 31.4*      Assessment/Plan: 32 y.o.  status post Cesarean section. POD# 1.   Doing well, stable.              Advance diet as tolerated Start po pain meds D/C foley  HLIV  Ambulate IS Routine post-op care  JACKSON-MOORE,Lindwood Mogel A 01/13/2013, 11:49 AM

## 2013-01-13 NOTE — Progress Notes (Signed)
Patient was referred for history of depression/anxiety. * Referral screened out by Clinical Social Worker because none of the following criteria appear to apply:  ~ History of anxiety/depression during this pregnancy, or of post-partum depression.  ~ Diagnosis of anxiety and/or depression within last 3 years  ~ History of depression due to pregnancy loss/loss of child  OR * Patient's symptoms currently being treated with medication and/or therapy.  Please contact the Clinical Social Worker if needs arise, or by the patient's request. Pt told CSW that she only experiences anxiety symptoms when pregnant.  No depression history.

## 2013-01-13 NOTE — Anesthesia Postprocedure Evaluation (Signed)
Anesthesia Post Note  Patient: Tracy Boyer  Procedure(s) Performed: Procedure(s) (LRB): REPEAT CESAREAN SECTION WITH BILATERAL TUBAL LIGATION (Bilateral)  Anesthesia type: Epidural  Patient location: Mother/Baby  Post pain: Pain level controlled  Post assessment: Post-op Vital signs reviewed  Last Vitals:  Filed Vitals:   01/13/13 0448  BP: 116/78  Pulse: 88  Temp:   Resp: 18    Post vital signs: Reviewed  Level of consciousness:alert  Complications: No apparent anesthesia complications.lbsab

## 2013-01-14 ENCOUNTER — Encounter (HOSPITAL_COMMUNITY): Payer: Self-pay | Admitting: Obstetrics & Gynecology

## 2013-01-14 ENCOUNTER — Other Ambulatory Visit (HOSPITAL_COMMUNITY): Payer: Medicaid Other

## 2013-01-14 NOTE — Progress Notes (Signed)
UR chart review completed.  

## 2013-01-14 NOTE — H&P (Signed)
NAME:  Tracy Boyer, Tracy Boyer NO.:  [id number]  MEDICAL RECORD NO.:  [id number]  LOCATION:                                 FACILITY:  PHYSICIAN:  Kathreen Cosier, M.D.DATE OF BIRTH:  [birth year]  DATE OF ADMISSION: DATE OF DISCHARGE:                             HISTORY & PHYSICAL   The patient is a 32 year old, gravida 7, para 3-3-0-6 whose EDC is January 23, 2013.  She is now at term and desires repeat C-section and tubal ligation.  Initially, the patient's platelets were 135, and they were followed throughout pregnancy and decreased on December 25, 2012, to 27. She was started on prednisone and repeat platelets on January 03, 2013, was 72.  Her platelets will be repeated prior to surgery.  Otherwise, her pregnancy was uneventful.  PAST MEDICAL HISTORY:  She has had 4 C-sections and 2 vaginal deliveries.  SOCIAL HISTORY:  Negative.  SYSTEM REVIEW:  Noncontributory.  PHYSICAL EXAMINATION:  GENERAL:  A well-developed female in no distress. HEENT:  Negative. LUNGS:  Clear to P and A. HEART:  Regular rhythm.  No murmurs, no gallops. BREASTS:  Engorged. ABDOMEN:  Term. PELVIC:  Cervix long, closed. EXTREMITIES:  Negative.          ______________________________ Kathreen Cosier, M.D.     BAM/MEDQ  D:  01/11/2013  T:  01/12/2013  Job:  [id number]

## 2013-01-14 NOTE — Progress Notes (Signed)
Patient ID: Tracy Boyer, female   DOB: 1980/12/26, 32 y.o.   MRN: [id number] Post operative day #2 Vital signs normal Incision clean and dry Lochia moderate Legs negative doing well and

## 2013-01-15 NOTE — Progress Notes (Signed)
Patient ID: Tracy Boyer, female   DOB: Feb 19, 1981, 32 y.o.   MRN: [id number] Postop day 3 Vital signs normal Fundus firm Incision clean and dry Discharge today

## 2013-01-15 NOTE — Discharge Summary (Signed)
Obstetric Discharge Summary Reason for Admission: onset of labor Prenatal Procedures: none Intrapartum Procedures: cesarean: low cervical, transverse Postpartum Procedures: P.P. tubal ligation Complications-Operative and Postpartum: none Hemoglobin  Date Value Range Status  01/13/2013 10.4* 12.0 - 15.0 g/dL Final     HCT  Date Value Range Status  01/13/2013 31.4* 36.0 - 46.0 % Final    Physical Exam:  General: alert Lochia: appropriate Uterine Fundus: firm Incision: healing well DVT Evaluation: No evidence of DVT seen on physical exam.  Discharge Diagnoses: Term Pregnancy-delivered  Discharge Information: Date: 01/15/2013 Activity: pelvic rest Diet: routine Medications: Percocet Condition: stable Instructions: refer to practice specific booklet Discharge to: home Follow-up Information   Schedule an appointment as soon as possible for a visit with Kathreen Cosier, MD.   Contact information:   [address] [place] [address] [phone]       Newborn Data: Live born female  Birth Weight: 7 lb 3 oz (3260 g) APGAR: 8, 9  Home with mother.  MARSHALL,BERNARD A 01/15/2013, 6:49 AM

## 2013-01-16 ENCOUNTER — Encounter (HOSPITAL_COMMUNITY): Admission: AD | Payer: Self-pay | Source: Ambulatory Visit

## 2013-01-16 ENCOUNTER — Inpatient Hospital Stay (HOSPITAL_COMMUNITY): Admission: AD | Admit: 2013-01-16 | Payer: Medicaid Other | Source: Ambulatory Visit | Admitting: Obstetrics

## 2013-01-16 LAB — TYPE AND SCREEN
ABO/RH(D): B POS
Antibody Screen: NEGATIVE
Unit division: 0

## 2013-01-16 SURGERY — Surgical Case
Anesthesia: Regional | Laterality: Bilateral

## 2013-05-23 ENCOUNTER — Other Ambulatory Visit: Payer: Self-pay

## 2013-09-20 ENCOUNTER — Emergency Department (HOSPITAL_COMMUNITY)
Admission: EM | Admit: 2013-09-20 | Discharge: 2013-09-20 | Disposition: A | Discharge status: Home / Self Care | Payer: Medicaid Other | Source: Home / Self Care | Attending: Family Medicine | Admitting: Family Medicine

## 2013-09-20 ENCOUNTER — Encounter (HOSPITAL_COMMUNITY): Payer: Self-pay | Admitting: Emergency Medicine

## 2013-09-20 DIAGNOSIS — J069 Acute upper respiratory infection, unspecified: Secondary | ICD-10-CM

## 2013-09-20 DIAGNOSIS — J029 Acute pharyngitis, unspecified: Secondary | ICD-10-CM

## 2013-09-20 LAB — POCT RAPID STREP A: Streptococcus, Group A Screen (Direct): NEGATIVE

## 2013-09-20 MED ORDER — DEXAMETHASONE SODIUM PHOSPHATE 10 MG/ML IJ SOLN
10.0000 mg | Freq: Once | INTRAMUSCULAR | Status: AC
  Administered 2013-09-20: 10 mg via INTRAMUSCULAR

## 2013-09-20 MED ORDER — LIDOCAINE VISCOUS 2 % MT SOLN
15.0000 mL | Freq: Once | OROMUCOSAL | Status: AC
  Administered 2013-09-20: 15 mL via OROMUCOSAL

## 2013-09-20 MED ORDER — PENICILLIN G BENZATHINE 1200000 UNIT/2ML IM SUSP
INTRAMUSCULAR | Status: AC
  Filled 2013-09-20: qty 2

## 2013-09-20 MED ORDER — PENICILLIN G BENZATHINE 1200000 UNIT/2ML IM SUSP
1.2000 10*6.[IU] | Freq: Once | INTRAMUSCULAR | Status: AC
  Administered 2013-09-20: 1.2 10*6.[IU] via INTRAMUSCULAR

## 2013-09-20 MED ORDER — DEXAMETHASONE SODIUM PHOSPHATE 10 MG/ML IJ SOLN
INTRAMUSCULAR | Status: AC
  Filled 2013-09-20: qty 1

## 2013-09-20 MED ORDER — MAGIC MOUTHWASH W/LIDOCAINE: 10.0000 mL | Freq: Four times a day (QID) | ORAL | Status: DC | PRN

## 2013-09-20 NOTE — ED Notes (Signed)
Patient complains of sore throat with swelling states she had some trouble breathing; states headaches and head congestion; denies fever/chills.

## 2013-09-20 NOTE — Discharge Instructions (Signed)
Salt Water Gargle This solution will help make your mouth and throat feel better. HOME CARE INSTRUCTIONS   Mix 1 teaspoon of salt in 8 ounces of warm water.  Gargle with this solution as much or often as you need or as directed. Swish and gargle gently if you have any sores or wounds in your mouth.  Do not swallow this mixture. Document Released: 04/07/2004 Document Revised: 09/26/2011 Document Reviewed: 08/29/2008 [facility] Patient Information 2014 [place], [place].  Pharyngitis Pharyngitis is a sore throat (pharynx). There is redness, pain, and swelling of your throat. HOME CARE   Drink enough fluids to keep your pee (urine) clear or pale yellow.  Only take medicine as told by your doctor.  You may get sick again if you do not take medicine as told. Finish your medicines, even if you start to feel better.  Do not take aspirin.  Rest.  Rinse your mouth (gargle) with salt water ( tsp of salt per 1 qt of water) every 1 2 hours. This will help the pain.  If you are not at risk for choking, you can suck on hard candy or sore throat lozenges. GET HELP IF:  You have large, tender lumps on your neck.  You have a rash.  You cough up green, yellow-brown, or bloody spit. GET HELP RIGHT AWAY IF:   You have a stiff neck.  You drool or cannot swallow liquids.  You throw up (vomit) or are not able to keep medicine or liquids down.  You have very bad pain that does not go away with medicine.  You have problems breathing (not from a stuffy nose). MAKE SURE YOU:   Understand these instructions.  Will watch your condition.  Will get help right away if you are not doing well or get worse. Document Released: 12/21/2007 Document Revised: 04/24/2013 Document Reviewed: 03/11/2013 [facility] Patient Information 2014 [place], [place].  Upper Respiratory Infection, Adult An upper respiratory infection (URI) is also known as the common cold. It is often caused by a type of germ (virus).  Colds are easily spread (contagious). You can pass it to others by kissing, coughing, sneezing, or drinking out of the same glass. Usually, you get better in 1 or 2 weeks.  HOME CARE   Only take medicine as told by your doctor.  Use a warm mist humidifier or breathe in steam from a hot shower.  Drink enough water and fluids to keep your pee (urine) clear or pale yellow.  Get plenty of rest.  Return to work when your temperature is back to normal or as told by your doctor. You may use a face mask and wash your hands to stop your cold from spreading. GET HELP RIGHT AWAY IF:   After the first few days, you feel you are getting worse.  You have questions about your medicine.  You have chills, shortness of breath, or brown or red spit (mucus).  You have yellow or brown snot (nasal discharge) or pain in the face, especially when you bend forward.  You have a fever, puffy (swollen) neck, pain when you swallow, or white spots in the back of your throat.  You have a bad headache, ear pain, sinus pain, or chest pain.  You have a high-pitched whistling sound when you breathe in and out (wheezing).  You have a lasting cough or cough up blood.  You have sore muscles or a stiff neck. MAKE SURE YOU:   Understand these instructions.  Will watch your condition.  Will get help right away if you are not doing well or get worse. Document Released: 12/21/2007 Document Revised: 09/26/2011 Document Reviewed: 11/08/2010 [facility] Patient Information 2014 [place], [place].

## 2013-09-20 NOTE — ED Provider Notes (Signed)
CSN: [id number]     Arrival date & time 09/20/13  0801 History   First MD Initiated Contact with Patient 09/20/13 0840     Chief Complaint  Patient presents with  . Sore Throat  . Oral Swelling    Patient is a 33 y.o. female presenting with pharyngitis. The history is provided by the patient.  Sore Throat This is a new problem. The current episode started 2 days ago. The problem occurs constantly. The problem has been gradually worsening. Pertinent negatives include no chest pain, no abdominal pain, no headaches and no shortness of breath. The symptoms are aggravated by swallowing. She has tried nothing for the symptoms.  Pt reports onset of mild URI type symtoms approx 2 days ago that included runny nose and pain in her ears. Last night she began to have a severe sore throat. Now very painful to swallow and pain in her bil ears worse. States her children have all been sick and she gets everything they bring home.   Past Medical History  Diagnosis Date  . Allergy   . Anemia   . Anxiety   . History of abnormal Pap smear 1997  . HELLP syndrome 2002  . Pregnancy induced hypertension   . Preeclampsia 2004  . Thrombocytopenia    Past Surgical History  Procedure Laterality Date  . Cesarean section      x4  . Tooth extraction      x6  . Cesarean section N/A 01/12/2013    Procedure: Repeat cesarean section with delivery of baby boy at 1004. Apgars 8/9. Bilateral tubal ligation.;  Surgeon: Antionette Char, MD;  Location: [facility];  Service: Obstetrics;  Laterality: N/A;   Family History  Problem Relation Age of Onset  . Hypertension Mother    History  Substance Use Topics  . Smoking status: Never Smoker   . Smokeless tobacco: Not on file  . Alcohol Use: No   OB History   Grav Para Term Preterm Abortions TAB SAB Ect Mult Living   7 7 4 3      7      Review of Systems  Constitutional: Negative for fever and chills.  HENT: Positive for congestion, ear pain, rhinorrhea, sore throat  and trouble swallowing. Negative for facial swelling, mouth sores, sinus pressure, sneezing and voice change.   Respiratory: Negative.  Negative for shortness of breath.   Cardiovascular: Negative.  Negative for chest pain.  Gastrointestinal: Negative.  Negative for abdominal pain.  Endocrine: Negative.   Genitourinary: Negative.   Musculoskeletal: Negative.   Skin: Negative.   Allergic/Immunologic: Negative.   Neurological: Negative.  Negative for headaches.  Hematological: Negative.   Psychiatric/Behavioral: Negative.     Allergies  Aspirin  Home Medications   Current Outpatient Rx  Name  Route  Sig  Dispense  Refill  . Alum & Mag Hydroxide-Simeth (MAGIC MOUTHWASH W/LIDOCAINE) SOLN   Oral   Take 10 mLs by mouth 4 (four) times daily as needed for mouth pain.   50 mL   0    BP 130/88  Pulse 89  Temp(Src) 98.8 F (37.1 C) (Oral)  Resp 18  SpO2 100%  Breastfeeding? No Physical Exam  Constitutional: She is oriented to person, place, and time. She appears well-developed and well-nourished.  HENT:  Head: Normocephalic and atraumatic.  Right Ear: Tympanic membrane, external ear and ear canal normal.  Left Ear: Tympanic membrane, external ear and ear canal normal.  Nose: Nose normal. Right sinus exhibits no maxillary sinus  tenderness and no frontal sinus tenderness. Left sinus exhibits no maxillary sinus tenderness and no frontal sinus tenderness.  Mouth/Throat: Uvula is midline and mucous membranes are normal. Oropharyngeal exudate, posterior oropharyngeal edema and posterior oropharyngeal erythema present. No tonsillar abscesses.  Neck: Neck supple.  Cardiovascular: Normal rate and regular rhythm.   Pulmonary/Chest: Effort normal and breath sounds normal.  Lymphadenopathy:    She has no cervical adenopathy.  Neurological: She is alert and oriented to person, place, and time.  Skin: Skin is warm and dry.  Psychiatric: She has a normal mood and affect.    ED Course   Procedures (including critical care time) Labs Review Labs Reviewed  POCT RAPID STREP A ([facility] CARE ONLY)   Imaging Review No results found.   MDM   1. Exudative pharyngitis   2. URI (upper respiratory infection)    2 days of URI type sx's w/ acute onset of severe sore throat last night. Denies fever. Exam suspicious for strep pharyngitis. (Pt unable to tolerate obtaining adequate rapid strep screen). Will treat clinically for styre pharyngitis. Treated w/ Bicillin la 1.2 mil units IM and Dexamethasone 10mg   IM. D/c' w/ home care instructions and Rx for magic mouthwash w/ Lidocaine.     Leanne ChangKatherine P Janellie Tennison, NP 09/20/13 1114

## 2013-09-20 NOTE — ED Provider Notes (Signed)
Medical screening examination/treatment/procedure(s) were performed by resident physician or non-physician practitioner and as supervising physician I was immediately available for consultation/collaboration.   KINDL,JAMES DOUGLAS MD.   James D Kindl, MD 09/20/13 1229 

## 2013-09-22 LAB — CULTURE, GROUP A STREP

## 2014-01-20 ENCOUNTER — Emergency Department (HOSPITAL_COMMUNITY)
Admission: EM | Admit: 2014-01-20 | Discharge: 2014-01-20 | Disposition: A | Discharge status: Home / Self Care | Payer: Medicaid Other | Source: Home / Self Care

## 2014-01-20 ENCOUNTER — Encounter (HOSPITAL_COMMUNITY): Payer: Self-pay | Admitting: Emergency Medicine

## 2014-01-20 ENCOUNTER — Emergency Department (INDEPENDENT_AMBULATORY_CARE_PROVIDER_SITE_OTHER): Payer: Medicaid Other

## 2014-01-20 DIAGNOSIS — X58XXXA Exposure to other specified factors, initial encounter: Secondary | ICD-10-CM

## 2014-01-20 DIAGNOSIS — S93609A Unspecified sprain of unspecified foot, initial encounter: Secondary | ICD-10-CM

## 2014-01-20 DIAGNOSIS — S93601A Unspecified sprain of right foot, initial encounter: Secondary | ICD-10-CM

## 2014-01-20 MED ORDER — KETOROLAC TROMETHAMINE 60 MG/2ML IM SOLN
60.0000 mg | Freq: Once | INTRAMUSCULAR | Status: AC
  Administered 2014-01-20: 60 mg via INTRAMUSCULAR

## 2014-01-20 MED ORDER — HYDROCODONE-ACETAMINOPHEN 5-325 MG PO TABS: 1.0000 | ORAL_TABLET | ORAL | Status: DC | PRN

## 2014-01-20 MED ORDER — KETOROLAC TROMETHAMINE 60 MG/2ML IM SOLN
INTRAMUSCULAR | Status: AC
  Filled 2014-01-20: qty 2

## 2014-01-20 NOTE — ED Provider Notes (Signed)
Medical screening examination/treatment/procedure(s) were performed by resident physician or non-physician practitioner and as supervising physician I was immediately available for consultation/collaboration.   Laia Wiley DOUGLAS MD.   Cleola Perryman D Anina Schnake, MD 01/20/14 2133 
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
## 2014-01-20 NOTE — Discharge Instructions (Signed)
RICE: Routine Care for Injuries The routine care of many injuries includes Rest, Ice, Compression, and Elevation (RICE). HOME CARE INSTRUCTIONS Rest is needed to allow your body to heal. Routine activities can usually be resumed when comfortable. Injured tendons and bones can take up to 6 weeks to heal. Tendons are the cord-like structures that attach muscle to bone. Ice following an injury helps keep the swelling down and reduces pain. Put ice in a plastic bag. Place a towel between your skin and the bag. Leave the ice on for 15-20 minutes, 3-4 times a day, or as directed by your health care provider. Do this while awake, for the first 24 to 48 hours. After that, continue as directed by your caregiver. Compression helps keep swelling down. It also gives support and helps with discomfort. If an elastic bandage has been applied, it should be removed and reapplied every 3 to 4 hours. It should not be applied tightly, but firmly enough to keep swelling down. Watch fingers or toes for swelling, bluish discoloration, coldness, numbness, or excessive pain. If any of these problems occur, remove the bandage and reapply loosely. Contact your caregiver if these problems continue. Elevation helps reduce swelling and decreases pain. With extremities, such as the arms, hands, legs, and feet, the injured area should be placed near or above the level of the heart, if possible. SEEK IMMEDIATE MEDICAL CARE IF: You have persistent pain and swelling. You develop redness, numbness, or unexpected weakness. Your symptoms are getting worse rather than improving after several days. These symptoms may indicate that further evaluation or further X-rays are needed. Sometimes, X-rays may not show a small broken bone (fracture) until 1 week or 10 days later. Make a follow-up appointment with your caregiver. Ask when your X-ray results will be ready. Make sure you get your X-ray results. Document Released: 10/16/2000 Document  Revised: 07/09/2013 Document Reviewed: 12/03/2010 ExitCare Patient Information 2015 [employer]. This information is not intended to replace advice given to you by your health care provider. Make sure you discuss any questions you have with your health care provider. Foot Sprain The muscles and cord like structures which attach muscle to bone (tendons) that surround the feet are made up of units. A foot sprain can occur at the weakest spot in any of these units. This condition is most often caused by injury to or overuse of the foot, as from playing contact sports, or aggravating a previous injury, or from poor conditioning, or obesity. SYMPTOMS  Pain with movement of the foot.  Tenderness and swelling at the injury site.  Loss of strength is present in moderate or severe sprains. THE THREE GRADES OR SEVERITY OF FOOT SPRAIN ARE:  Mild (Grade I): Slightly pulled muscle without tearing of muscle or tendon fibers or loss of strength.  Moderate (Grade II): Tearing of fibers in a muscle, tendon, or at the attachment to bone, with small decrease in strength.  Severe (Grade III): Rupture of the muscle-tendon-bone attachment, with separation of fibers. Severe sprain requires surgical repair. Often repeating (chronic) sprains are caused by overuse. Sudden (acute) sprains are caused by direct injury or over-use. DIAGNOSIS  Diagnosis of this condition is usually by your own observation. If problems continue, a caregiver may be required for further evaluation and treatment. X-rays may be required to make sure there are not breaks in the bones (fractures) present. Continued problems may require physical therapy for treatment. PREVENTION  Use strength and conditioning exercises appropriate for your sport.  Warm   up properly prior to working out.  Use athletic shoes that are made for the sport you are participating in.  Allow adequate time for healing. Early return to activities makes repeat injury  more likely, and can lead to an unstable arthritic foot that can result in prolonged disability. Mild sprains generally heal in 3 to 10 days, with moderate and severe sprains taking 2 to 10 weeks. Your caregiver can help you determine the proper time required for healing. HOME CARE INSTRUCTIONS   Apply ice to the injury for 15-20 minutes, 03-04 times per day. Put the ice in a plastic bag and place a towel between the bag of ice and your skin.  An elastic wrap (like an Ace bandage) may be used to keep swelling down.  Keep foot above the level of the heart, or at least raised on a footstool, when swelling and pain are present.  Try to avoid use other than gentle range of motion while the foot is painful. Do not resume use until instructed by your caregiver. Then begin use gradually, not increasing use to the point of pain. If pain does develop, decrease use and continue the above measures, gradually increasing activities that do not cause discomfort, until you gradually achieve normal use.  Use crutches if and as instructed, and for the length of time instructed.  Keep injured foot and ankle wrapped between treatments.  Massage foot and ankle for comfort and to keep swelling down. Massage from the toes up towards the knee.  Only take over-the-counter or prescription medicines for pain, discomfort, or fever as directed by your caregiver. SEEK IMMEDIATE MEDICAL CARE IF:   Your pain and swelling increase, or pain is not controlled with medications.  You have loss of feeling in your foot or your foot turns cold or blue.  You develop new, unexplained symptoms, or an increase of the symptoms that brought you to your caregiver. MAKE SURE YOU:   Understand these instructions.  Will watch your condition.  Will get help right away if you are not doing well or get worse. Document Released: 12/24/2001 Document Revised: 09/26/2011 Document Reviewed: 02/21/2008 Exit[employer] Patient Information 2015  [employer]. This information is not intended to replace advice given to you by your health care provider. Make sure you discuss any questions you have with your health care provider.  

## 2014-05-02 ENCOUNTER — Other Ambulatory Visit: Payer: Self-pay

## 2014-05-19 ENCOUNTER — Encounter (HOSPITAL_COMMUNITY): Payer: Self-pay | Admitting: Emergency Medicine

## 2015-06-07 ENCOUNTER — Encounter (HOSPITAL_COMMUNITY): Payer: Self-pay | Admitting: *Deleted

## 2015-06-07 ENCOUNTER — Emergency Department (HOSPITAL_COMMUNITY): Payer: Medicaid Other

## 2015-06-07 ENCOUNTER — Observation Stay (HOSPITAL_COMMUNITY)
Admission: EM | Admit: 2015-06-07 | Discharge: 2015-06-09 | Disposition: A | Discharge status: Home / Self Care | Payer: Medicaid Other | Attending: Orthopedic Surgery | Admitting: Orthopedic Surgery

## 2015-06-07 ENCOUNTER — Emergency Department (HOSPITAL_COMMUNITY): Payer: Medicaid Other | Admitting: Anesthesiology

## 2015-06-07 ENCOUNTER — Encounter (HOSPITAL_COMMUNITY)
Admission: EM | Disposition: A | Discharge status: Home / Self Care | Payer: Self-pay | Source: Home / Self Care | Attending: Emergency Medicine

## 2015-06-07 DIAGNOSIS — S52209A Unspecified fracture of shaft of unspecified ulna, initial encounter for closed fracture: Secondary | ICD-10-CM | POA: Diagnosis present

## 2015-06-07 DIAGNOSIS — R51 Headache: Principal | ICD-10-CM | POA: Insufficient documentation

## 2015-06-07 DIAGNOSIS — S52201A Unspecified fracture of shaft of right ulna, initial encounter for closed fracture: Secondary | ICD-10-CM | POA: Diagnosis not present

## 2015-06-07 DIAGNOSIS — S5291XA Unspecified fracture of right forearm, initial encounter for closed fracture: Secondary | ICD-10-CM

## 2015-06-07 DIAGNOSIS — M542 Cervicalgia: Secondary | ICD-10-CM | POA: Diagnosis not present

## 2015-06-07 DIAGNOSIS — S3991XA Unspecified injury of abdomen, initial encounter: Secondary | ICD-10-CM | POA: Insufficient documentation

## 2015-06-07 DIAGNOSIS — Y92411 Interstate highway as the place of occurrence of the external cause: Secondary | ICD-10-CM | POA: Insufficient documentation

## 2015-06-07 DIAGNOSIS — R402412 Glasgow coma scale score 13-15, at arrival to emergency department: Secondary | ICD-10-CM | POA: Insufficient documentation

## 2015-06-07 DIAGNOSIS — S161XXA Strain of muscle, fascia and tendon at neck level, initial encounter: Secondary | ICD-10-CM

## 2015-06-07 DIAGNOSIS — S52601A Unspecified fracture of lower end of right ulna, initial encounter for closed fracture: Secondary | ICD-10-CM

## 2015-06-07 DIAGNOSIS — S060X1A Concussion with loss of consciousness of 30 minutes or less, initial encounter: Secondary | ICD-10-CM | POA: Insufficient documentation

## 2015-06-07 DIAGNOSIS — S52301A Unspecified fracture of shaft of right radius, initial encounter for closed fracture: Secondary | ICD-10-CM | POA: Diagnosis not present

## 2015-06-07 DIAGNOSIS — S299XXA Unspecified injury of thorax, initial encounter: Secondary | ICD-10-CM | POA: Insufficient documentation

## 2015-06-07 DIAGNOSIS — F17200 Nicotine dependence, unspecified, uncomplicated: Secondary | ICD-10-CM | POA: Insufficient documentation

## 2015-06-07 DIAGNOSIS — M79601 Pain in right arm: Secondary | ICD-10-CM | POA: Diagnosis present

## 2015-06-07 DIAGNOSIS — S5290XA Unspecified fracture of unspecified forearm, initial encounter for closed fracture: Secondary | ICD-10-CM

## 2015-06-07 DIAGNOSIS — S298XXA Other specified injuries of thorax, initial encounter: Secondary | ICD-10-CM

## 2015-06-07 HISTORY — PX: OPEN REDUCTION INTERNAL FIXATION (ORIF) DISTAL RADIAL FRACTURE: SHX5989

## 2015-06-07 LAB — COMPREHENSIVE METABOLIC PANEL
ALT: 14 U/L (ref 14–54)
ANION GAP: 10 (ref 5–15)
AST: 29 U/L (ref 15–41)
Albumin: 3.3 g/dL — ABNORMAL LOW (ref 3.5–5.0)
Alkaline Phosphatase: 33 U/L — ABNORMAL LOW (ref 38–126)
BUN: 7 mg/dL (ref 6–20)
CHLORIDE: 107 mmol/L (ref 101–111)
CO2: 21 mmol/L — AB (ref 22–32)
Calcium: 8.8 mg/dL — ABNORMAL LOW (ref 8.9–10.3)
Creatinine, Ser: 0.97 mg/dL (ref 0.44–1.00)
GFR calc non Af Amer: >60 mL/min (ref >60)
Glucose, Bld: 119 mg/dL — ABNORMAL HIGH (ref 65–99)
POTASSIUM: 3.9 mmol/L (ref 3.5–5.1)
SODIUM: 138 mmol/L (ref 135–145)
Total Bilirubin: 0.5 mg/dL (ref 0.3–1.2)
Total Protein: 6.4 g/dL — ABNORMAL LOW (ref 6.5–8.1)

## 2015-06-07 LAB — CBC
HEMATOCRIT: 36.7 % (ref 36.0–46.0)
HEMOGLOBIN: 11.7 g/dL — AB (ref 12.0–15.0)
MCH: 25.4 pg — AB (ref 26.0–34.0)
MCHC: 31.9 g/dL (ref 30.0–36.0)
MCV: 79.6 fL (ref 78.0–100.0)
Platelets: DECREASED 10*3/uL (ref 150–400)
RBC: 4.61 MIL/uL (ref 3.87–5.11)
RDW: 13.3 % (ref 11.5–15.5)
WBC: 6.3 10*3/uL (ref 4.0–10.5)

## 2015-06-07 LAB — SAMPLE TO BLOOD BANK

## 2015-06-07 LAB — PROTIME-INR
INR: 1.2 (ref 0.00–1.49)
Prothrombin Time: 15.3 seconds — ABNORMAL HIGH (ref 11.6–15.2)

## 2015-06-07 LAB — I-STAT BETA HCG BLOOD, ED (MC, WL, AP ONLY): I-stat hCG, quantitative: <5 m[IU]/mL (ref <5)

## 2015-06-07 LAB — CBG MONITORING, ED: GLUCOSE-CAPILLARY: 111 mg/dL — AB (ref 65–99)

## 2015-06-07 LAB — ETHANOL: Alcohol, Ethyl (B): 13 mg/dL — ABNORMAL HIGH (ref <5)

## 2015-06-07 SURGERY — OPEN REDUCTION INTERNAL FIXATION (ORIF) DISTAL RADIUS FRACTURE
Anesthesia: Regional | Site: Arm Lower | Laterality: Right

## 2015-06-07 MED ORDER — BUPIVACAINE-EPINEPHRINE (PF) 0.5% -1:200000 IJ SOLN
INTRAMUSCULAR | Status: DC | PRN
  Administered 2015-06-07: 25 mL via PERINEURAL

## 2015-06-07 MED ORDER — SUCCINYLCHOLINE CHLORIDE 20 MG/ML IJ SOLN
INTRAMUSCULAR | Status: AC
  Filled 2015-06-07: qty 1

## 2015-06-07 MED ORDER — ONDANSETRON HCL 4 MG/2ML IJ SOLN
4.0000 mg | Freq: Once | INTRAMUSCULAR | Status: AC
  Administered 2015-06-07: 4 mg via INTRAVENOUS
  Filled 2015-06-07: qty 2

## 2015-06-07 MED ORDER — PROPOFOL 10 MG/ML IV BOLUS
INTRAVENOUS | Status: AC
Start: 2015-06-07 — End: 2015-06-07
  Filled 2015-06-07: qty 20

## 2015-06-07 MED ORDER — ONDANSETRON HCL 4 MG PO TABS: 4.0000 mg | ORAL_TABLET | Freq: Four times a day (QID) | ORAL | Status: DC | PRN

## 2015-06-07 MED ORDER — LIDOCAINE HCL (CARDIAC) 20 MG/ML IV SOLN
INTRAVENOUS | Status: AC
  Filled 2015-06-07: qty 5

## 2015-06-07 MED ORDER — CEFAZOLIN SODIUM-DEXTROSE 2-3 GM-% IV SOLR
INTRAVENOUS | Status: DC | PRN
  Administered 2015-06-07: 2 g via INTRAVENOUS

## 2015-06-07 MED ORDER — IOHEXOL 300 MG/ML  SOLN
100.0000 mL | Freq: Once | INTRAMUSCULAR | Status: AC | PRN
  Administered 2015-06-07: 100 mL via INTRAVENOUS

## 2015-06-07 MED ORDER — METHOCARBAMOL 500 MG PO TABS
500.0000 mg | ORAL_TABLET | Freq: Four times a day (QID) | ORAL | Status: DC | PRN
  Administered 2015-06-08 – 2015-06-09 (×3): 500 mg via ORAL
  Filled 2015-06-07 (×3): qty 1

## 2015-06-07 MED ORDER — CEFAZOLIN SODIUM 1-5 GM-% IV SOLN
1.0000 g | Freq: Three times a day (TID) | INTRAVENOUS | Status: DC
  Administered 2015-06-08 – 2015-06-09 (×6): 1 g via INTRAVENOUS
  Filled 2015-06-07 (×10): qty 50

## 2015-06-07 MED ORDER — MIDAZOLAM HCL 2 MG/2ML IJ SOLN
INTRAMUSCULAR | Status: AC
  Filled 2015-06-07: qty 2

## 2015-06-07 MED ORDER — PROPOFOL 10 MG/ML IV BOLUS
INTRAVENOUS | Status: AC
  Filled 2015-06-07: qty 20

## 2015-06-07 MED ORDER — ONDANSETRON HCL 4 MG/2ML IJ SOLN
INTRAMUSCULAR | Status: DC | PRN
  Administered 2015-06-07: 4 mg via INTRAVENOUS

## 2015-06-07 MED ORDER — LIDOCAINE HCL (CARDIAC) 20 MG/ML IV SOLN
INTRAVENOUS | Status: DC | PRN
  Administered 2015-06-07: 40 mg via INTRAVENOUS

## 2015-06-07 MED ORDER — PROPOFOL 500 MG/50ML IV EMUL
INTRAVENOUS | Status: DC | PRN
  Administered 2015-06-07: 50 ug/kg/min via INTRAVENOUS

## 2015-06-07 MED ORDER — FENTANYL CITRATE (PF) 100 MCG/2ML IJ SOLN
50.0000 ug | Freq: Once | INTRAMUSCULAR | Status: AC
Start: 2015-06-07 — End: 2015-06-07
  Administered 2015-06-07: 50 ug via INTRAVENOUS
  Filled 2015-06-07: qty 2

## 2015-06-07 MED ORDER — OXYCODONE HCL 5 MG/5ML PO SOLN: 5.0000 mg | Freq: Once | ORAL | Status: DC | PRN

## 2015-06-07 MED ORDER — FENTANYL CITRATE (PF) 100 MCG/2ML IJ SOLN
50.0000 ug | Freq: Once | INTRAMUSCULAR | Status: AC
  Administered 2015-06-07: 50 ug via INTRAVENOUS

## 2015-06-07 MED ORDER — PROPOFOL 10 MG/ML IV BOLUS
INTRAVENOUS | Status: DC | PRN
  Administered 2015-06-07: 40 mg via INTRAVENOUS
  Administered 2015-06-07: 10 mg via INTRAVENOUS

## 2015-06-07 MED ORDER — PROMETHAZINE HCL 25 MG RE SUPP: 12.5000 mg | Freq: Four times a day (QID) | RECTAL | Status: DC | PRN

## 2015-06-07 MED ORDER — VITAMIN C 500 MG PO TABS
1000.0000 mg | ORAL_TABLET | Freq: Every day | ORAL | Status: DC
  Administered 2015-06-07 – 2015-06-09 (×3): 1000 mg via ORAL
  Filled 2015-06-07 (×2): qty 2

## 2015-06-07 MED ORDER — CEFAZOLIN SODIUM-DEXTROSE 2-3 GM-% IV SOLR
INTRAVENOUS | Status: AC
  Filled 2015-06-07: qty 50

## 2015-06-07 MED ORDER — HYDROMORPHONE HCL 1 MG/ML IJ SOLN: 0.2500 mg | INTRAMUSCULAR | Status: DC | PRN

## 2015-06-07 MED ORDER — MEPERIDINE HCL 25 MG/ML IJ SOLN: 6.2500 mg | INTRAMUSCULAR | Status: DC | PRN

## 2015-06-07 MED ORDER — ONDANSETRON HCL 4 MG/2ML IJ SOLN
INTRAMUSCULAR | Status: AC
  Filled 2015-06-07: qty 2

## 2015-06-07 MED ORDER — ALPRAZOLAM 0.5 MG PO TABS: 0.5000 mg | ORAL_TABLET | Freq: Four times a day (QID) | ORAL | Status: DC | PRN

## 2015-06-07 MED ORDER — FENTANYL CITRATE (PF) 100 MCG/2ML IJ SOLN
INTRAMUSCULAR | Status: AC
  Filled 2015-06-07: qty 2

## 2015-06-07 MED ORDER — CEFAZOLIN SODIUM 1-5 GM-% IV SOLN
1.0000 g | INTRAVENOUS | Status: AC
  Filled 2015-06-07: qty 50

## 2015-06-07 MED ORDER — OXYCODONE HCL 5 MG PO TABS
5.0000 mg | ORAL_TABLET | ORAL | Status: DC | PRN
  Administered 2015-06-07 – 2015-06-09 (×9): 10 mg via ORAL
  Filled 2015-06-07 (×9): qty 2

## 2015-06-07 MED ORDER — OXYCODONE HCL 5 MG PO TABS: 5.0000 mg | ORAL_TABLET | Freq: Once | ORAL | Status: DC | PRN

## 2015-06-07 MED ORDER — DIPHENHYDRAMINE HCL 25 MG PO CAPS
25.0000 mg | ORAL_CAPSULE | Freq: Four times a day (QID) | ORAL | Status: DC | PRN
  Administered 2015-06-08 (×3): 25 mg via ORAL
  Filled 2015-06-07 (×3): qty 1

## 2015-06-07 MED ORDER — ONDANSETRON HCL 4 MG/2ML IJ SOLN: 4.0000 mg | Freq: Four times a day (QID) | INTRAMUSCULAR | Status: DC | PRN

## 2015-06-07 MED ORDER — SODIUM CHLORIDE 0.9 % IV SOLN
INTRAVENOUS | Status: DC | PRN
  Administered 2015-06-07 (×2): via INTRAVENOUS

## 2015-06-07 MED ORDER — MORPHINE SULFATE (PF) 2 MG/ML IV SOLN
1.0000 mg | INTRAVENOUS | Status: DC | PRN
  Administered 2015-06-08 (×2): 1 mg via INTRAVENOUS
  Filled 2015-06-07 (×2): qty 1

## 2015-06-07 MED ORDER — INFLUENZA VAC SPLIT QUAD 0.5 ML IM SUSY
0.5000 mL | PREFILLED_SYRINGE | INTRAMUSCULAR | Status: DC
Start: 2015-06-08 — End: 2015-06-09
  Filled 2015-06-07: qty 0.5

## 2015-06-07 MED ORDER — METHOCARBAMOL 1000 MG/10ML IJ SOLN: 500.0000 mg | Freq: Four times a day (QID) | INTRAVENOUS | Status: DC | PRN

## 2015-06-07 MED ORDER — DOCUSATE SODIUM 100 MG PO CAPS
100.0000 mg | ORAL_CAPSULE | Freq: Two times a day (BID) | ORAL | Status: DC
  Administered 2015-06-07 – 2015-06-09 (×5): 100 mg via ORAL
  Filled 2015-06-07 (×5): qty 1

## 2015-06-07 MED ORDER — MIDAZOLAM HCL 2 MG/2ML IJ SOLN
INTRAMUSCULAR | Status: DC | PRN
  Administered 2015-06-07 (×2): 1 mg via INTRAVENOUS

## 2015-06-07 MED ORDER — FENTANYL CITRATE (PF) 250 MCG/5ML IJ SOLN
INTRAMUSCULAR | Status: AC
  Filled 2015-06-07: qty 5

## 2015-06-07 MED ORDER — LACTATED RINGERS IV SOLN
INTRAVENOUS | Status: DC
  Administered 2015-06-08: via INTRAVENOUS

## 2015-06-07 SURGICAL SUPPLY — 58 items
BANDAGE ELASTIC 3 VELCRO ST LF (GAUZE/BANDAGES/DRESSINGS) ×2 IMPLANT
BANDAGE ELASTIC 4 VELCRO ST LF (GAUZE/BANDAGES/DRESSINGS) ×2 IMPLANT
BIT DRILL 2.5X2.75 QC CALB (BIT) ×1 IMPLANT
BIT DRILL CALIBRATED 2.7 (BIT) ×1 IMPLANT
BLADE SURG ROTATE 9660 (MISCELLANEOUS) IMPLANT
BNDG CMPR 9X4 STRL LF SNTH (GAUZE/BANDAGES/DRESSINGS) ×1
BNDG ESMARK 4X9 LF (GAUZE/BANDAGES/DRESSINGS) ×2 IMPLANT
BNDG GAUZE ELAST 4 BULKY (GAUZE/BANDAGES/DRESSINGS) ×4 IMPLANT
CORDS BIPOLAR (ELECTRODE) ×2 IMPLANT
COVER SURGICAL LIGHT HANDLE (MISCELLANEOUS) ×2 IMPLANT
CUFF TOURNIQUET SINGLE 18IN (TOURNIQUET CUFF) ×2 IMPLANT
CUFF TOURNIQUET SINGLE 24IN (TOURNIQUET CUFF) IMPLANT
DECANTER SPIKE VIAL GLASS SM (MISCELLANEOUS) IMPLANT
DRAIN TLS ROUND 10FR (DRAIN) IMPLANT
DRAPE OEC MINIVIEW 54X84 (DRAPES) IMPLANT
DRAPE U-SHAPE 47X51 STRL (DRAPES) ×2 IMPLANT
DRSG ADAPTIC 3X8 NADH LF (GAUZE/BANDAGES/DRESSINGS) ×2 IMPLANT
GAUZE SPONGE 4X4 12PLY STRL (GAUZE/BANDAGES/DRESSINGS) ×2 IMPLANT
GAUZE XEROFORM 5X9 LF (GAUZE/BANDAGES/DRESSINGS) ×2 IMPLANT
GLOVE ORTHO TXT STRL SZ7.5 (GLOVE) ×2 IMPLANT
GLOVE SS BIOGEL STRL SZ 8 (GLOVE) ×1 IMPLANT
GLOVE SUPERSENSE BIOGEL SZ 8 (GLOVE) ×1
GOWN STRL REUS W/ TWL LRG LVL3 (GOWN DISPOSABLE) ×3 IMPLANT
GOWN STRL REUS W/ TWL XL LVL3 (GOWN DISPOSABLE) ×3 IMPLANT
GOWN STRL REUS W/TWL LRG LVL3 (GOWN DISPOSABLE) ×6
GOWN STRL REUS W/TWL XL LVL3 (GOWN DISPOSABLE) ×6
KIT BASIN OR (CUSTOM PROCEDURE TRAY) ×2 IMPLANT
KIT ROOM TURNOVER OR (KITS) ×2 IMPLANT
LOOP VESSEL MAXI BLUE (MISCELLANEOUS) IMPLANT
MANIFOLD NEPTUNE II (INSTRUMENTS) ×2 IMPLANT
NEEDLE 22X1 1/2 (OR ONLY) (NEEDLE) IMPLANT
NS IRRIG 1000ML POUR BTL (IV SOLUTION) ×2 IMPLANT
PACK ORTHO EXTREMITY (CUSTOM PROCEDURE TRAY) ×2 IMPLANT
PAD ARMBOARD 7.5X6 YLW CONV (MISCELLANEOUS) ×4 IMPLANT
PAD CAST 3X4 CTTN HI CHSV (CAST SUPPLIES) IMPLANT
PAD CAST 4YDX4 CTTN HI CHSV (CAST SUPPLIES) ×1 IMPLANT
PADDING CAST COTTON 3X4 STRL (CAST SUPPLIES) ×2
PADDING CAST COTTON 4X4 STRL (CAST SUPPLIES) ×2
PLATE LOCK COMP 7H FOOT (Plate) ×1 IMPLANT
PLATE LOCK COMP 9H 3.5 FOOT (Plate) ×1 IMPLANT
SCREW CORT 3.5X16 815037016 (Screw) ×4 IMPLANT
SCREW LOCK CORT STAR 3.5X10 (Screw) ×1 IMPLANT
SCREW LOCK CORT STAR 3.5X12 (Screw) ×11 IMPLANT
SCREW LOCK CORT STAR 3.5X14 (Screw) ×1 IMPLANT
SPLINT FIBERGLASS 4X30 (CAST SUPPLIES) ×1 IMPLANT
SPONGE GAUZE 4X4 12PLY STER LF (GAUZE/BANDAGES/DRESSINGS) ×1 IMPLANT
SPONGE LAP 4X18 X RAY DECT (DISPOSABLE) IMPLANT
SUT MNCRL AB 4-0 PS2 18 (SUTURE) ×2 IMPLANT
SUT PROLENE 3 0 PS 2 (SUTURE) ×8 IMPLANT
SUT VIC AB 3-0 FS2 27 (SUTURE) ×3 IMPLANT
SYR CONTROL 10ML LL (SYRINGE) IMPLANT
SYSTEM CHEST DRAIN TLS 7FR (DRAIN) IMPLANT
TOWEL OR 17X24 6PK STRL BLUE (TOWEL DISPOSABLE) ×3 IMPLANT
TOWEL OR 17X26 10 PK STRL BLUE (TOWEL DISPOSABLE) ×2 IMPLANT
TUBE CONNECTING 12X1/4 (SUCTIONS) ×2 IMPLANT
TUBE EVACUATION TLS (MISCELLANEOUS) ×2 IMPLANT
UNDERPAD 30X30 INCONTINENT (UNDERPADS AND DIAPERS) ×2 IMPLANT
WATER STERILE IRR 1000ML POUR (IV SOLUTION) ×2 IMPLANT

## 2015-06-07 NOTE — Op Note (Signed)
See [id number]#[id number] Amanda PeaGramig MD

## 2015-06-07 NOTE — ED Notes (Signed)
Report given to OR RN in OR holding
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
## 2015-06-07 NOTE — ED Notes (Signed)
Pt resting , no complaints , awaiting OR  Consent signed

## 2015-06-07 NOTE — ED Notes (Signed)
All belongings given to boyfriends mom including cell phone , pt stated that this was ok

## 2015-06-07 NOTE — ED Notes (Signed)
MD at bedside. 

## 2015-06-07 NOTE — ED Notes (Signed)
Returned from ct scan 

## 2015-06-07 NOTE — Progress Notes (Signed)
Patient arrived to floor.

## 2015-06-07 NOTE — Anesthesia Preprocedure Evaluation (Addendum)
Anesthesia Evaluation  Patient identified by MRN, date of birth, ID band Patient awake    Reviewed: Allergy & Precautions, NPO status , Patient's Chart, lab work & pertinent test results  Airway Mallampati: I  TM Distance: >3 FB Neck ROM: Full    Dental  (+) Teeth Intact, Dental Advisory Given   Pulmonary Current Smoker,    breath sounds clear to auscultation       Cardiovascular  Rhythm:Regular Rate:Normal     Neuro/Psych    GI/Hepatic   Endo/Other    Renal/GU      Musculoskeletal   Abdominal   Peds  Hematology   Anesthesia Other Findings Hx of 5 C sections.  Not pregnant now by serum HCG  Reproductive/Obstetrics                           Anesthesia Physical Anesthesia Plan  ASA: I and emergent  Anesthesia Plan: Regional   Post-op Pain Management:    Induction: Intravenous  Airway Management Planned:   Additional Equipment:   Intra-op Plan:   Post-operative Plan: Extubation in OR  Informed Consent: I have reviewed the patients History and Physical, chart, labs and discussed the procedure including the risks, benefits and alternatives for the proposed anesthesia with the patient or authorized representative who has indicated his/her understanding and acceptance.   Dental advisory given  Plan Discussed with: CRNA, Anesthesiologist and Surgeon  Anesthesia Plan Comments:        Anesthesia Quick Evaluation
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
## 2015-06-07 NOTE — Anesthesia Postprocedure Evaluation (Signed)
Anesthesia Post Note  Patient: Tracy Boyer  Procedure(s) Performed: Procedure(s) (LRB): OPEN REDUCTION INTERNAL FIXATION RIGHT RADIUS AND ULNA FRACTURES (Right)  Patient location during evaluation: PACU Anesthesia Type: Regional and MAC Level of consciousness: awake and alert Pain management: pain level controlled Vital Signs Assessment: post-procedure vital signs reviewed and stable Respiratory status: spontaneous breathing Cardiovascular status: stable Anesthetic complications: no    Last Vitals:  Filed Vitals:   06/07/15 1411 06/07/15 1430  BP: 105/74 119/83  Pulse: 93 101  Temp: 36.5 C   Resp: 12 18    Last Pain:  Filed Vitals:   06/07/15 1435  PainSc: 0-No pain                 Carlisle Enke A

## 2015-06-07 NOTE — Progress Notes (Signed)
Orthopedic Tech Progress Note Patient Details:  Tracy Boyer February 28, 1981 [id number] Applied arm elevator sling to RUE. Ortho Devices Type of Ortho Device: Arm sling Ortho Device/Splint Location: RUE Ortho Device/Splint Interventions: Application   Lesle ChrisGilliland, Jessica Seidman L 06/07/2015, 7:58 PM
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
## 2015-06-07 NOTE — Transfer of Care (Signed)
Immediate Anesthesia Transfer of Care Note  Patient: Tracy Boyer  Procedure(s) Performed: Procedure(s): OPEN REDUCTION INTERNAL FIXATION RIGHT RADIUS AND ULNA FRACTURES (Right)  Patient Location: PACU  Anesthesia Type:MAC combined with regional for post-op pain  Level of Consciousness: awake and alert   Airway & Oxygen Therapy: Patient Spontanous Breathing  Post-op Assessment: Report given to RN and Post -op Vital signs reviewed and stable  Post vital signs: Reviewed and stable  Last Vitals:  Filed Vitals:   06/07/15 0830 06/07/15 0915  BP: 108/66 108/73  Pulse: 95 79  Temp:    Resp: 19 21    Complications: No apparent anesthesia complications

## 2015-06-07 NOTE — Anesthesia Procedure Notes (Addendum)
Anesthesia Regional Block:  Infraclavicular brachial plexus block  Pre-Anesthetic Checklist: ,, timeout performed, Correct Patient, Correct Site, Correct Laterality, Correct Procedure, Correct Position, site marked, Risks and benefits discussed,  Surgical consent,  Pre-op evaluation,  At surgeon's request and post-op pain management  Laterality: Right and Upper  Prep: chloraprep       Needles:  Injection technique: Single-shot  Needle Type: Echogenic Stimulator Needle     Needle Length: 5cm 5 cm Needle Gauge: 21 and 21 G    Additional Needles:  Procedures: ultrasound guided (picture in chart) Infraclavicular brachial plexus block Narrative:  Start time: 06/07/2015 10:56 AM End time: 06/07/2015 11:01 AM Injection made incrementally with aspirations every 5 mL.  Performed by: Personally  Anesthesiologist: CREWS, DAVID   Procedure Name: MAC Date/Time: 06/07/2015 11:21 AM Performed by: Alanda AmassFRIEDMAN, Rodgerick Gilliand A Pre-anesthesia Checklist: Patient identified, Timeout performed, Emergency Drugs available, Suction available and Patient being monitored Patient Re-evaluated:Patient Re-evaluated prior to inductionOxygen Delivery Method: Nasal cannula Dental Injury: Teeth and Oropharynx as per pre-operative assessment       Right Infraclavicular block image

## 2015-06-07 NOTE — ED Notes (Signed)
To c-t  Pt talked to her sister on the phone.  Nauseated med given

## 2015-06-08 ENCOUNTER — Encounter (HOSPITAL_COMMUNITY): Payer: Self-pay | Admitting: Orthopedic Surgery

## 2015-06-08 NOTE — Progress Notes (Signed)
Orthopedic Tech Progress Note Patient Details:  Elder Negusngela K XXXMcDaniel 1981/02/01 [id number]  Ortho Devices Type of Ortho Device: Arm sling Ortho Device/Splint Location: rue Ortho Device/Splint Interventions: Application   Nikki DomCrawford, Summer Parthasarathy 06/08/2015, 11:31 AM
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
## 2015-06-08 NOTE — Progress Notes (Signed)
Subjective: 1 Day Post-Op Procedure(s) (LRB): OPEN REDUCTION INTERNAL FIXATION RIGHT RADIUS AND ULNA FRACTURES (Right) Patient reports pain as fairly controlled. The she states that the upper extremity is still quite tender. She denies significant numbness of the upper extremity. She denies headache today, she denies any visual changes today. She denies nausea, vomiting, fever or chills. She is tolerating a regular diet. She is voiding without difficulties.  Objective: Vital signs in last 24 hours: Temp:  [98.2 F (36.8 C)-98.8 F (37.1 C)] 98.8 F (37.1 C) (11/21 0522) Pulse Rate:  [72-84] 72 (11/21 0522) Resp:  [16-17] 16 (11/21 0522) BP: (95-120)/(66-80) 120/80 mmHg (11/21 0522) SpO2:  [97 %-100 %] 97 % (11/21 0522)  Intake/Output from previous day: 11/20 0701 - 11/21 0700 In: 1100 [I.V.:1000; IV Piggyback:100] Out: 570 [Urine:550; Blood:20] Intake/Output this shift: Total I/O In: 400 [I.V.:400] Out: -    Recent Labs  06/07/15 0603  HGB 11.7*    Recent Labs  06/07/15 0603  WBC 6.3  RBC 4.61  HCT 36.7  PLT PLATELET CLUMPS NOTED ON SMEAR, COUNT APPEARS DECREASED    Recent Labs  06/07/15 0603  NA 138  K 3.9  CL 107  CO2 21*  BUN 7  CREATININE 0.97  GLUCOSE 119*  CALCIUM 8.8*    Recent Labs  06/07/15 0603  INR 1.20    Evaluation of the right upper extremity shows that her dressings, splint, clean dry and intact. Digital range of motion is intact sensation refill is intact. No signs of infection or ascending cellulitis present. The patient is alert and oriented in no acute distress. The patient complains of pain in the affected upper extremity.  The patient is noted to have a normal HEENT exam. Lung fields show equal  chest expansion and no shortness of breath. Abdomen exam is nontender without distention. Lower extremity examination does not show any fracture dislocation or blood clot symptoms. Pelvis is stable and the neck and back are stable and  nontender.  Assessment/Plan: 1 Day Post-Op Procedure(s) (LRB): OPEN REDUCTION INTERNAL FIXATION RIGHT RADIUS AND ULNA FRACTURES (Right)  We have discussed with the patient all issues. We will continue pain management and observation tonight and consider discharge tomorrow. We discussed with the patient all discharge issues. She will continue to work on elevation and edema control of the right upper extremity. When she is ambulatory sling use will be applied. She will need to follow up with us in approximately 2 weeks for a dressing change and wound check and reapplication of a cast/splint. All questions were encouraged and answered. Tassie Pollett L 06/08/2015, 6:02 PM

## 2015-06-09 MED ORDER — METHOCARBAMOL 500 MG PO TABS: 500.0000 mg | ORAL_TABLET | Freq: Four times a day (QID) | ORAL | Status: DC | PRN

## 2015-06-09 MED ORDER — OXYCODONE HCL 5 MG PO TABS: 5.0000 mg | ORAL_TABLET | ORAL | Status: DC | PRN
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
## 2015-06-09 NOTE — Discharge Instructions (Signed)

## 2015-06-10 ENCOUNTER — Encounter (HOSPITAL_COMMUNITY): Payer: Self-pay | Admitting: Emergency Medicine

## 2015-08-11 ENCOUNTER — Ambulatory Visit: Payer: No Typology Code available for payment source | Attending: Orthopedic Surgery | Admitting: Occupational Therapy

## 2015-10-24 ENCOUNTER — Encounter (HOSPITAL_COMMUNITY): Payer: Self-pay | Admitting: Emergency Medicine

## 2015-10-24 ENCOUNTER — Ambulatory Visit (HOSPITAL_COMMUNITY)
Admission: EM | Admit: 2015-10-24 | Discharge: 2015-10-24 | Disposition: A | Discharge status: Home / Self Care | Payer: Medicaid Other | Attending: Family Medicine | Admitting: Family Medicine

## 2015-10-24 DIAGNOSIS — G44209 Tension-type headache, unspecified, not intractable: Secondary | ICD-10-CM

## 2015-10-24 MED ORDER — KETOROLAC TROMETHAMINE 60 MG/2ML IM SOLN
INTRAMUSCULAR | Status: AC
  Filled 2015-10-24: qty 2

## 2015-10-24 MED ORDER — KETOROLAC TROMETHAMINE 60 MG/2ML IM SOLN
60.0000 mg | Freq: Once | INTRAMUSCULAR | Status: AC
  Administered 2015-10-24: 60 mg via INTRAMUSCULAR

## 2015-10-24 MED ORDER — NAPROXEN 375 MG PO TABS: 375.0000 mg | ORAL_TABLET | Freq: Two times a day (BID) | ORAL | Status: DC

## 2015-10-24 NOTE — ED Notes (Signed)
C/ointermittent HAs and feeling dizzy onset x2 weeks associated w/fatigue .... Reports sx increase w/certain foods... A&O x4... No acute distress.

## 2015-10-24 NOTE — ED Provider Notes (Signed)
CSN: [id number]     Arrival date & time 10/24/15  1259 History   First MD Initiated Contact with Patient 10/24/15 1311     Chief Complaint  Patient presents with  . Headache  . Dizziness   (Consider location/radiation/quality/duration/timing/severity/associated sxs/prior Treatment) HPI Comments: 35 year old female stating that she has been having headaches for the past several weeks. They occur approximately 4 out of 7 days in a week. In generally occur in the morning after awakening around 8:00 and will last until about 12:00 on average. They tend to occur in the back of the head in the neck and sometimes radiate forward to the face. Denies associated photophobia or vomiting but occasionally has mild transient self-limiting nausea. Denies focal paresthesias or weakness. Denies problems with vision, speech, hearing, swallowing.   Past Medical History  Diagnosis Date  . Allergy   . Anemia   . Anxiety   . History of abnormal Pap smear 1997  . HELLP syndrome 2002  . Pregnancy induced hypertension   . Preeclampsia 2004  . Thrombocytopenia [facility](HCC)    Past Surgical History  Procedure Laterality Date  . Cesarean section      x4  . Tooth extraction      x6  . Cesarean section N/A 01/12/2013    Procedure: Repeat cesarean section with delivery of baby boy at 1004. Apgars 8/9. Bilateral tubal ligation.;  Surgeon: Antionette CharLisa Jackson-Moore, MD;  Location: [facility];  Service: Obstetrics;  Laterality: N/A;  . Open reduction internal fixation (orif) distal radial fracture Right 06/07/2015    Procedure: OPEN REDUCTION INTERNAL FIXATION RIGHT RADIUS AND ULNA FRACTURES;  Surgeon: Dominica SeverinWilliam Gramig, MD;  Location: [facility] OR;  Service: Orthopedics;  Laterality: Right;  . Tubal ligation Bilateral    Family History  Problem Relation Age of Onset  . Hypertension Mother    Social History  Substance Use Topics  . Smoking status: Current Every Day Smoker  . Smokeless tobacco: None  . Alcohol Use: Yes   OB History    Gravida Para Term Preterm AB TAB SAB Ectopic Multiple Living   7 7 4 3  0 0 0 0  7     Review of Systems  Constitutional: Positive for activity change. Negative for fever and fatigue.  Respiratory: Negative for cough, shortness of breath and wheezing.   Cardiovascular: Negative for chest pain and palpitations.  Gastrointestinal: Negative.   Genitourinary: Negative.   Musculoskeletal: Negative.   Skin: Negative for color change, pallor and rash.  Neurological: Positive for headaches. Negative for tremors, seizures, syncope, speech difficulty, weakness and numbness.       Occasional rare self-limiting dizziness.  Psychiatric/Behavioral: Negative.     Allergies  Aspirin  Home Medications   Prior to Admission medications   Medication Sig Start Date End Date Taking? Authorizing Provider  Alum & Mag Hydroxide-Simeth (MAGIC MOUTHWASH W/LIDOCAINE) SOLN Take 10 mLs by mouth 4 (four) times daily as needed for mouth pain. 09/20/13   Roma KayserKatherine P Schorr, NP  HYDROcodone-acetaminophen (NORCO/VICODIN) 5-325 MG per tablet Take 1-2 tablets by mouth every 4 (four) hours as needed. 01/20/14   Servando Salinaatherine H Rossi, NP  methocarbamol (ROBAXIN) 500 MG tablet Take 1 tablet (500 mg total) by mouth every 6 (six) hours as needed for muscle spasms. 06/09/15   Karie ChimeraBrian Buchanan, PA-C  naproxen (NAPROSYN) 375 MG tablet Take 1 tablet (375 mg total) by mouth 2 (two) times daily. 10/24/15   Hayden Rasmussenavid Adrienna Karis, NP  oxyCODONE (OXY IR/ROXICODONE) 5 MG immediate release tablet Take 1-2  tablets (5-10 mg total) by mouth every 3 (three) hours as needed for moderate pain. 06/09/15   Karie Chimera, PA-C   Meds Ordered and Administered this Visit   Medications  ketorolac (TORADOL) injection 60 mg (not administered)    BP 122/82 mmHg  Pulse 66  Temp(Src) 97.9 F (36.6 C) (Oral)  Resp 18  SpO2 100%  LMP 10/22/2015 No data found.   Physical Exam  Constitutional: She is oriented to person, place, and time. She appears well-developed  and well-nourished. No distress.  HENT:  Head: Normocephalic and atraumatic.  Mouth/Throat: No oropharyngeal exudate.  Bilateral TMs are normal. Oropharynx is clear. Soft palate rises symmetrically. Tongue and uvula midline. Swallowing reflex normal.  Eyes: Conjunctivae and EOM are normal. Pupils are equal, round, and reactive to light.  No nystagmus.  Neck: Normal range of motion. Neck supple.  Tenderness to the bilateral para cervical musculature, and particular splenius capitis muscles as well as occipital musculature.  Cardiovascular: Normal rate and normal heart sounds.   Pulmonary/Chest: Effort normal and breath sounds normal. No respiratory distress. She has no wheezes. She has no rales.  Abdominal: Soft. There is no tenderness.  Musculoskeletal: Normal range of motion. She exhibits no edema.  Lymphadenopathy:    She has no cervical adenopathy.  Neurological: She is alert and oriented to person, place, and time. She has normal strength. She displays no tremor. No cranial nerve deficit or sensory deficit. She exhibits normal muscle tone. She displays no seizure activity. Coordination and gait normal. GCS eye subscore is 4. GCS verbal subscore is 5. GCS motor subscore is 6.  Skin: Skin is warm and dry. She is not diaphoretic.  Psychiatric: She has a normal mood and affect.  Nursing note and vitals reviewed.   ED Course  Procedures (including critical care time)  Labs Review Labs Reviewed - No data to display  Imaging Review No results found.   Visual Acuity Review  Right Eye Distance:   Left Eye Distance:   Bilateral Distance:    Right Eye Near:   Left Eye Near:    Bilateral Near:         MDM   1. Tension headache    Neurologic exam is normal. Meds ordered this encounter  Medications  . ketorolac (TORADOL) injection 60 mg    Sig:   . naproxen (NAPROSYN) 375 MG tablet    Sig: Take 1 tablet (375 mg total) by mouth 2 (two) times daily.    Dispense:  20  tablet    Refill:  0    Order Specific Question:  Supervising Provider    Answer:  Linna Hoff [[id number]]       Hayden Rasmussen, NP 10/24/15 [phone]

## 2015-10-24 NOTE — Discharge Instructions (Signed)
Tension Headache A tension headache is pain, pressure, or aching that is felt over the front and sides of your head. These headaches can last from 30 minutes to several days. HOME CARE Managing Pain  Take over-the-counter and prescription medicines only as told by your doctor.  Lie down in a dark, quiet room when you have a headache.  If directed, apply ice to your head and neck area:  Put ice in a plastic bag.  Place a towel between your skin and the bag.  Leave the ice on for 20 minutes, 2-3 times per day.  Use a heating pad or a hot shower to apply heat to your head and neck area as told by your doctor. Eating and Drinking  Eat meals on a regular schedule.  Do not drink a lot of alcohol.  Do not use a lot of caffeine, or stop using caffeine. General Instructions  Keep all follow-up visits as told by your doctor. This is important.  Keep a journal to find out if certain things bring on headaches. For example, write down:  What you eat and drink.  How much sleep you get.  Any change to your diet or medicines.  Try getting a massage, or doing other things that help you to relax.  Lessen stress.  Sit up straight. Do not tighten (tense) your muscles.  Do not use tobacco products. This includes cigarettes, chewing tobacco, or e-cigarettes. If you need help quitting, ask your doctor.  Exercise regularly as told by your doctor.  Get enough sleep. This may mean 7-9 hours of sleep. GET HELP IF:  Your symptoms are not helped by medicine.  You have a headache that feels different from your usual headache.  You feel sick to your stomach (nauseous) or you throw up (vomit).  You have a fever. GET HELP RIGHT AWAY IF:  Your headache becomes very bad.  You keep throwing up.  You have a stiff neck.  You have trouble seeing.  You have trouble speaking.  You have pain in your eye or ear.  Your muscles are weak or you lose muscle control.  You lose your balance  or you have trouble walking.  You feel like you will pass out (faint) or you pass out.  You have confusion.   This information is not intended to replace advice given to you by your health care provider. Make sure you discuss any questions you have with your health care provider.   Document Released: 09/28/2009 Document Revised: 03/25/2015 Document Reviewed: 10/27/2014 Elsevier Interactive Patient Education 2016 [employer]  

## 2016-10-23 ENCOUNTER — Emergency Department (HOSPITAL_COMMUNITY)
Admission: EM | Admit: 2016-10-23 | Discharge: 2016-10-23 | Disposition: A | Discharge status: Home / Self Care | Payer: Medicaid Other | Attending: Emergency Medicine | Admitting: Emergency Medicine

## 2016-10-23 ENCOUNTER — Other Ambulatory Visit: Payer: Self-pay

## 2016-10-23 ENCOUNTER — Emergency Department (HOSPITAL_COMMUNITY): Payer: Medicaid Other

## 2016-10-23 ENCOUNTER — Encounter (HOSPITAL_COMMUNITY): Payer: Self-pay | Admitting: Emergency Medicine

## 2016-10-23 DIAGNOSIS — F17200 Nicotine dependence, unspecified, uncomplicated: Secondary | ICD-10-CM | POA: Diagnosis not present

## 2016-10-23 DIAGNOSIS — R072 Precordial pain: Secondary | ICD-10-CM | POA: Diagnosis present

## 2016-10-23 DIAGNOSIS — R079 Chest pain, unspecified: Secondary | ICD-10-CM

## 2016-10-23 LAB — BASIC METABOLIC PANEL
ANION GAP: 7 (ref 5–15)
BUN: 7 mg/dL (ref 6–20)
CHLORIDE: 108 mmol/L (ref 101–111)
CO2: 22 mmol/L (ref 22–32)
Calcium: 9.2 mg/dL (ref 8.9–10.3)
Creatinine, Ser: 0.84 mg/dL (ref 0.44–1.00)
GFR calc Af Amer: >60 mL/min (ref >60)
GLUCOSE: 86 mg/dL (ref 65–99)
POTASSIUM: 3.7 mmol/L (ref 3.5–5.1)
Sodium: 137 mmol/L (ref 135–145)

## 2016-10-23 LAB — I-STAT TROPONIN, ED: Troponin i, poc: 0 ng/mL (ref 0.00–0.08)

## 2016-10-23 LAB — CBC
HCT: 35.7 % — ABNORMAL LOW (ref 36.0–46.0)
HEMOGLOBIN: 11 g/dL — AB (ref 12.0–15.0)
MCH: 23.1 pg — AB (ref 26.0–34.0)
MCHC: 30.8 g/dL (ref 30.0–36.0)
MCV: 74.8 fL — ABNORMAL LOW (ref 78.0–100.0)
Platelets: 122 10*3/uL — ABNORMAL LOW (ref 150–400)
RBC: 4.77 MIL/uL (ref 3.87–5.11)
RDW: 14.9 % (ref 11.5–15.5)
WBC: 5.4 10*3/uL (ref 4.0–10.5)
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
## 2016-10-23 NOTE — ED Triage Notes (Signed)
Cp that comes and goes since last night made her little finger curl some sob no n/v

## 2016-10-23 NOTE — Discharge Instructions (Signed)
Return to the ED with any concerns including difficulty breathing, fainting, worsening chest pain, leg swelling, decreased level of alertness/lethargy, or any other alarming symptoms °

## 2017-01-08 ENCOUNTER — Emergency Department (HOSPITAL_BASED_OUTPATIENT_CLINIC_OR_DEPARTMENT_OTHER)
Admission: EM | Admit: 2017-01-08 | Discharge: 2017-01-08 | Disposition: A | Discharge status: Home / Self Care | Payer: Medicaid Other | Attending: Emergency Medicine | Admitting: Emergency Medicine

## 2017-01-08 ENCOUNTER — Encounter (HOSPITAL_BASED_OUTPATIENT_CLINIC_OR_DEPARTMENT_OTHER): Payer: Self-pay | Admitting: Emergency Medicine

## 2017-01-08 DIAGNOSIS — Y999 Unspecified external cause status: Secondary | ICD-10-CM | POA: Insufficient documentation

## 2017-01-08 DIAGNOSIS — F17200 Nicotine dependence, unspecified, uncomplicated: Secondary | ICD-10-CM | POA: Diagnosis not present

## 2017-01-08 DIAGNOSIS — S39012A Strain of muscle, fascia and tendon of lower back, initial encounter: Secondary | ICD-10-CM | POA: Diagnosis not present

## 2017-01-08 DIAGNOSIS — S3992XA Unspecified injury of lower back, initial encounter: Secondary | ICD-10-CM | POA: Diagnosis present

## 2017-01-08 DIAGNOSIS — Y939 Activity, unspecified: Secondary | ICD-10-CM | POA: Diagnosis not present

## 2017-01-08 DIAGNOSIS — Y929 Unspecified place or not applicable: Secondary | ICD-10-CM | POA: Diagnosis not present

## 2017-01-08 DIAGNOSIS — T148XXA Other injury of unspecified body region, initial encounter: Secondary | ICD-10-CM

## 2017-01-08 MED ORDER — METHOCARBAMOL 500 MG PO TABS: 1000.0000 mg | ORAL_TABLET | Freq: Four times a day (QID) | ORAL | 0 refills | Status: DC

## 2017-01-08 MED ORDER — NAPROXEN 500 MG PO TABS: 500.0000 mg | ORAL_TABLET | Freq: Two times a day (BID) | ORAL | 0 refills | Status: DC
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
## 2017-01-08 NOTE — ED Notes (Signed)
Pt ambulatory in waiting room, in NAD

## 2017-01-08 NOTE — ED Notes (Signed)
Patient was seen walking out with out paper work. Told that patient that I was on my way in to get her d/c  - patient chose to leave without paper work
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
## 2017-01-08 NOTE — ED Notes (Signed)
ED Provider at bedside. 

## 2017-01-27 ENCOUNTER — Emergency Department (HOSPITAL_COMMUNITY): Payer: No Typology Code available for payment source

## 2017-01-27 ENCOUNTER — Encounter (HOSPITAL_COMMUNITY): Payer: Self-pay | Admitting: Emergency Medicine

## 2017-01-27 ENCOUNTER — Emergency Department (HOSPITAL_COMMUNITY)
Admission: EM | Admit: 2017-01-27 | Discharge: 2017-01-27 | Disposition: A | Discharge status: Home / Self Care | Payer: No Typology Code available for payment source | Attending: Emergency Medicine | Admitting: Emergency Medicine

## 2017-01-27 DIAGNOSIS — S8012XA Contusion of left lower leg, initial encounter: Secondary | ICD-10-CM

## 2017-01-27 DIAGNOSIS — Y9389 Activity, other specified: Secondary | ICD-10-CM | POA: Insufficient documentation

## 2017-01-27 DIAGNOSIS — Y999 Unspecified external cause status: Secondary | ICD-10-CM | POA: Insufficient documentation

## 2017-01-27 DIAGNOSIS — M542 Cervicalgia: Secondary | ICD-10-CM

## 2017-01-27 DIAGNOSIS — S161XXA Strain of muscle, fascia and tendon at neck level, initial encounter: Secondary | ICD-10-CM

## 2017-01-27 DIAGNOSIS — M62838 Other muscle spasm: Secondary | ICD-10-CM | POA: Diagnosis not present

## 2017-01-27 DIAGNOSIS — Z79899 Other long term (current) drug therapy: Secondary | ICD-10-CM | POA: Diagnosis not present

## 2017-01-27 DIAGNOSIS — S199XXA Unspecified injury of neck, initial encounter: Secondary | ICD-10-CM | POA: Diagnosis present

## 2017-01-27 DIAGNOSIS — Y92410 Unspecified street and highway as the place of occurrence of the external cause: Secondary | ICD-10-CM | POA: Diagnosis not present

## 2017-01-27 DIAGNOSIS — F17200 Nicotine dependence, unspecified, uncomplicated: Secondary | ICD-10-CM | POA: Insufficient documentation

## 2017-01-27 DIAGNOSIS — S80812A Abrasion, left lower leg, initial encounter: Secondary | ICD-10-CM

## 2017-01-27 MED ORDER — CYCLOBENZAPRINE HCL 10 MG PO TABS: 10.0000 mg | ORAL_TABLET | Freq: Three times a day (TID) | ORAL | 0 refills | Status: DC | PRN

## 2017-01-27 MED ORDER — TETANUS-DIPHTH-ACELL PERTUSSIS 5-2.5-18.5 LF-MCG/0.5 IM SUSP: 0.5000 mL | Freq: Once | INTRAMUSCULAR | Status: DC

## 2017-01-27 MED ORDER — NAPROXEN 500 MG PO TABS: 500.0000 mg | ORAL_TABLET | Freq: Two times a day (BID) | ORAL | 0 refills | Status: DC | PRN

## 2017-01-27 MED ORDER — NAPROXEN 500 MG PO TABS
500.0000 mg | ORAL_TABLET | Freq: Once | ORAL | Status: AC
  Administered 2017-01-27: 500 mg via ORAL
  Filled 2017-01-27: qty 1

## 2017-01-27 NOTE — Discharge Instructions (Signed)
Take naprosyn as directed for inflammation and pain with tylenol for breakthrough pain and flexeril for muscle relaxation. Do not drive or operate machinery with muscle relaxant use. Ice to areas of soreness for the next 24 hours and then may move to heat, no more than 20 minutes at a time every hour for each. On the abrasion area, use topical antibiotic ointment and a bandage until it heals. Expect to be sore for the next few days and follow up with primary care physician for recheck of ongoing symptoms in the next 1-2 weeks. Return to ER for emergent changing or worsening of symptoms.
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
## 2017-01-27 NOTE — ED Notes (Addendum)
Patient transported to X-Ray 

## 2017-01-27 NOTE — ED Notes (Signed)
Bed: WTR9 Expected date:  Expected time:  Means of arrival:  Comments: Tracy Boyer
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
## 2017-08-12 ENCOUNTER — Other Ambulatory Visit: Payer: Self-pay

## 2017-08-12 ENCOUNTER — Ambulatory Visit (INDEPENDENT_AMBULATORY_CARE_PROVIDER_SITE_OTHER): Payer: Medicaid Other

## 2017-08-12 ENCOUNTER — Ambulatory Visit (HOSPITAL_COMMUNITY)
Admission: EM | Admit: 2017-08-12 | Discharge: 2017-08-12 | Disposition: A | Discharge status: Home / Self Care | Payer: Medicaid Other | Attending: Family Medicine | Admitting: Family Medicine

## 2017-08-12 ENCOUNTER — Encounter (HOSPITAL_COMMUNITY): Payer: Self-pay | Admitting: *Deleted

## 2017-08-12 DIAGNOSIS — S61300A Unspecified open wound of right index finger with damage to nail, initial encounter: Secondary | ICD-10-CM

## 2017-08-12 DIAGNOSIS — S67190A Crushing injury of right index finger, initial encounter: Secondary | ICD-10-CM

## 2017-08-12 DIAGNOSIS — S6710XA Crushing injury of unspecified finger(s), initial encounter: Secondary | ICD-10-CM | POA: Diagnosis not present
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
## 2017-08-12 NOTE — ED Provider Notes (Signed)
[facility]    CSN: [id number] Arrival date & time: 08/12/17  1353     History   Chief Complaint Chief Complaint  Patient presents with  . Finger Injury    HPI Tracy Boyer is a 37 y.o. female.   Shaundra presents with complaints of right pointer finger pain and injury after it was slammed in a door accidentally approximately 1 hour prior to arrival. Pain at tip of nail as her acrylic and natural nail has pulled away from the bed as well as small laceration to pad of finger. Sensation intact. Acrylic nail still in place and distal nail still in bed. Pain 10/10 to distal tuft of pointer finger. Denies any previous injury to finger.   ROS per HPI.       Past Medical History:  Diagnosis Date  . Allergy   . Anemia   . Anxiety   . HELLP syndrome 2002  . History of abnormal Pap smear 1997  . Preeclampsia 2004  . Pregnancy induced hypertension   . Thrombocytopenia [facility])     Patient Active Problem List   Diagnosis Date Noted  . Forearm fractures, both bones, closed 06/07/2015  . Cesarean delivery, without mention of indication, delivered, with or without mention of antepartum condition 01/12/2013  . Migraine 08/14/2012  . Pregnancy 08/14/2012    Past Surgical History:  Procedure Laterality Date  . CESAREAN SECTION     x4  . CESAREAN SECTION N/A 01/12/2013   Procedure: Repeat cesarean section with delivery of baby boy at 1004. Apgars 8/9. Bilateral tubal ligation.;  Surgeon: Antionette Char, MD;  Location: [facility];  Service: Obstetrics;  Laterality: N/A;  . OPEN REDUCTION INTERNAL FIXATION (ORIF) DISTAL RADIAL FRACTURE Right 06/07/2015   Procedure: OPEN REDUCTION INTERNAL FIXATION RIGHT RADIUS AND ULNA FRACTURES;  Surgeon: Dominica Severin, MD;  Location: [facility];  Service: Orthopedics;  Laterality: Right;  . TOOTH EXTRACTION     x6  . TUBAL LIGATION Bilateral     OB History    Gravida Para Term Preterm AB Living   7 7 4 3  0 7   SAB TAB Ectopic  Multiple Live Births   0 0 0   7       Home Medications    Prior to Admission medications   Medication Sig Start Date End Date Taking? Authorizing Provider  Alum & Mag Hydroxide-Simeth (MAGIC MOUTHWASH W/LIDOCAINE) SOLN Take 10 mLs by mouth 4 (four) times daily as needed for mouth pain. 09/20/13   Schorr, Roma Kayser, NP  cyclobenzaprine (FLEXERIL) 10 MG tablet Take 1 tablet (10 mg total) by mouth 3 (three) times daily as needed for muscle spasms. 01/27/17   Street, [place], PA-C  methocarbamol (ROBAXIN) 500 MG tablet Take 2 tablets (1,000 mg total) by mouth 4 (four) times daily. 01/08/17   Renne Crigler, PA-C  naproxen (NAPROSYN) 500 MG tablet Take 1 tablet (500 mg total) by mouth 2 (two) times daily. 01/08/17   Renne Crigler, PA-C  naproxen (NAPROSYN) 500 MG tablet Take 1 tablet (500 mg total) by mouth 2 (two) times daily as needed for mild pain, moderate pain or headache (TAKE WITH MEALS.). 01/27/17   Street, [place], PA-C    Family History Family History  Problem Relation Age of Onset  . Hypertension Mother     Social History Social History   Tobacco Use  . Smoking status: Former Games developer  . Smokeless tobacco: Never Used  Substance Use Topics  . Alcohol use: No  Frequency: Never  . Drug use: No     Allergies   Patient has no active allergies.   Review of Systems Review of Systems   Physical Exam Triage Vital Signs ED Triage Vitals  Enc Vitals Group     BP 08/12/17 1511 117/69     Pulse Rate 08/12/17 1511 76     Resp 08/12/17 1511 16     Temp 08/12/17 1511 98 F (36.7 C)     Temp Source 08/12/17 1511 Oral     SpO2 08/12/17 1511 98 %     Weight --      Height --      Head Circumference --      Peak Flow --      Pain Score 08/12/17 1514 10     Pain Loc --      Pain Edu? --      Excl. in GC? --    No data found.  Updated Vital Signs BP 117/69   Pulse 76   Temp 98 F (36.7 C) (Oral)   Resp 16   LMP 08/05/2017 (Approximate)   SpO2 98%   Visual  Acuity Right Eye Distance:   Left Eye Distance:   Bilateral Distance:    Right Eye Near:   Left Eye Near:    Bilateral Near:     Physical Exam  Constitutional: She is oriented to person, place, and time. She appears well-developed and well-nourished. No distress.  Cardiovascular: Normal rate, regular rhythm and normal heart sounds.  Pulmonary/Chest: Effort normal and breath sounds normal.  Musculoskeletal:       Right hand: She exhibits laceration. She exhibits no tenderness, no bony tenderness, normal capillary refill and no deformity. Normal sensation noted. Normal strength noted.  Tenderness to tuft; full rom to DIP and PIP joint without tenderness; nail pulled from bed at proximal edge, with small amount of bleeding see photo; small laceration to finger pad, without active bleeding  Neurological: She is alert and oriented to person, place, and time.  Skin: Skin is warm and dry.         UC Treatments / Results  Labs (all labs ordered are listed, but only abnormal results are displayed) Labs Reviewed - No data to display  EKG  EKG Interpretation None       Radiology Dg Finger Index Right  Result Date: 08/12/2017 CLINICAL DATA:  Caught finger in car door with nail deformity, initial encounter EXAM: RIGHT INDEX FINGER 2+V COMPARISON:  None. FINDINGS: There is a break of the patient's nail just beyond the tip of the finger. No definitive bony abnormality is noted. No other soft tissue abnormality is seen. IMPRESSION: No definitive bony abnormality is noted. Electronically Signed   By: Alcide Clever M.D.   On: 08/12/2017 16:13    Procedures Procedures (including critical care time)  Medications Ordered in UC Medications - No data to display   Initial Impression / Assessment and Plan / UC Course  I have reviewed the triage vital signs and the nursing notes.  Pertinent labs & imaging results that were available during my care of the patient were reviewed by me and  considered in my medical decision making (see chart for details).     Xray without fracture. Laceration shallow with edges naturally well approximately, no further closure needed at this time. Wounds soaked and irrigated with saline. Dressed with antibiotic ointment and bulky gauze dressing. Bleeding controlled. Discussed nail break with supervising physician Dr. Carmelia Roller who recommended  observation of nail as it will fall off on it's own, unless patient requests removal today. Patient refuses nail removal at this time. Wound care discussed. Recommended follow up with PCP next week for recheck. Return precautions provided.  Patient verbalized understanding and agreeable to plan.    Final Clinical Impressions(s) / UC Diagnoses   Final diagnoses:  Crushing injury of finger, initial encounter    ED Discharge Orders    None       Controlled Substance Prescriptions [place] Controlled Substance Registry consulted? Not Applicable   Georgetta HaberBurky, Trenell Concannon B, NP 08/12/17 1630

## 2017-08-12 NOTE — Discharge Instructions (Signed)
Your nail will eventually fall off.  Wash finger and wound twice a day with soap and water. Keep covered to keep clean. Please follow up with your primary care provider next week for a recheck. If develop increased pain, redness, persistent bleeding or otherwise worsening of symptoms return or go to Er.

## 2017-08-16 ENCOUNTER — Ambulatory Visit (HOSPITAL_COMMUNITY)
Admission: EM | Admit: 2017-08-16 | Discharge: 2017-08-16 | Disposition: A | Discharge status: Home / Self Care | Payer: Medicaid Other | Attending: Family Medicine | Admitting: Family Medicine

## 2017-08-16 ENCOUNTER — Encounter (HOSPITAL_COMMUNITY): Payer: Self-pay | Admitting: Emergency Medicine

## 2017-08-16 DIAGNOSIS — S61300D Unspecified open wound of right index finger with damage to nail, subsequent encounter: Secondary | ICD-10-CM

## 2017-08-16 DIAGNOSIS — S61309D Unspecified open wound of unspecified finger with damage to nail, subsequent encounter: Secondary | ICD-10-CM

## 2017-08-16 MED ORDER — HYDROCODONE-ACETAMINOPHEN 5-325 MG PO TABS: 1.0000 | ORAL_TABLET | Freq: Four times a day (QID) | ORAL | 0 refills | Status: DC | PRN

## 2017-08-16 MED ORDER — LIDOCAINE HCL 2 % IJ SOLN
INTRAMUSCULAR | Status: AC
  Filled 2017-08-16: qty 20

## 2017-08-16 NOTE — ED Triage Notes (Signed)
PT C/O: reports persistent pain to right index finger.... Seen here on 08/12/17 for shutting her finger in car door   Sx today include: pain  TAKING MEDS: none   A&O x4... NAD... Ambulatory

## 2017-08-16 NOTE — ED Notes (Signed)
Band aid and gauze wrap applied to pts right finger.

## 2017-08-16 NOTE — Discharge Instructions (Addendum)
You may use over the counter ibuprofen or acetaminophen as needed.   Be aware, pain medications may cause drowsiness. Please do not drive, operate heavy machinery or make important decisions while on this medication, it can cloud your judgement.

## 2017-08-21 NOTE — ED Provider Notes (Signed)
  [facility]   [id number] 08/16/17 Arrival Time: 1001  ASSESSMENT & PLAN:  1. Fingernail avulsion, subsequent encounter     Meds ordered this encounter  Medications  . HYDROcodone-acetaminophen (NORCO/VICODIN) 5-325 MG tablet    Sig: Take 1 tablet by mouth every 6 (six) hours as needed for moderate pain or severe pain.    Dispense:  6 tablet    Refill:  0    Procedure: R second distal finger cleaned with betadine/alcohol. Approx  1 cc lidocaine plain injected at avulsed nail into nailbed. Hemostats to easily remove nail. No complications. Minimal bleeding that is controlled. Nailbed intact. Wound care instructions given. OTC analgesics in addition to prn Norco.  Rec f/u in 48 hours for wound check, sooner if needed.  Reviewed expectations re: course of current medical issues. Questions answered. Outlined signs and symptoms indicating need for more acute intervention. Patient verbalized understanding. After Visit Summary given.   SUBJECTIVE:  Tracy Boyer is a 37 y.o. female who presents with a R 2nd fingernail injury; avulsed nail. Seen here on 1/26. Visit reviewed. She reports continued discomfort. No bleeding. No sensation changes.  ROS: As per HPI.   OBJECTIVE:  Vitals:   08/16/17 1025  BP: 121/82  Pulse: 67  Resp: 18  Temp: 98.2 F (36.8 C)  TempSrc: Oral  SpO2: 100%    General appearance: alert; no distress Skin: R 2nd finger with distal nail avulsion; tender; no swelling or bleeding Psychological:  alert and cooperative; normal mood and affect  No Known Allergies  Social History   Socioeconomic History  . Marital status: Single    Spouse name: None  . Number of children: None  . Years of education: None  . Highest education level: None  Social Needs  . Financial resource strain: None  . Food insecurity - worry: None  . Food insecurity - inability: None  . Transportation needs - medical: None  . Transportation needs -  non-medical: None  Occupational History  . None  Tobacco Use  . Smoking status: Former Games developermoker  . Smokeless tobacco: Never Used  Substance and Sexual Activity  . Alcohol use: No    Frequency: Never  . Drug use: No  . Sexual activity: Yes    Partners: Male  Other Topics Concern  . None  Social History Narrative   ** Merged History Encounter **             Mardella LaymanHagler, Thuy Atilano, MD 08/21/17 [phone]

## 2017-11-17 ENCOUNTER — Encounter (HOSPITAL_COMMUNITY): Payer: Self-pay | Admitting: Emergency Medicine

## 2017-11-17 ENCOUNTER — Ambulatory Visit (HOSPITAL_COMMUNITY)
Admission: EM | Admit: 2017-11-17 | Discharge: 2017-11-17 | Disposition: A | Discharge status: Home / Self Care | Payer: Medicaid Other | Attending: Emergency Medicine | Admitting: Emergency Medicine

## 2017-11-17 DIAGNOSIS — Z87891 Personal history of nicotine dependence: Secondary | ICD-10-CM | POA: Diagnosis not present

## 2017-11-17 DIAGNOSIS — Z9851 Tubal ligation status: Secondary | ICD-10-CM | POA: Diagnosis not present

## 2017-11-17 DIAGNOSIS — N898 Other specified noninflammatory disorders of vagina: Secondary | ICD-10-CM | POA: Insufficient documentation

## 2017-11-17 DIAGNOSIS — Z8249 Family history of ischemic heart disease and other diseases of the circulatory system: Secondary | ICD-10-CM | POA: Insufficient documentation

## 2017-11-17 LAB — POCT URINALYSIS DIP (DEVICE)
Bilirubin Urine: NEGATIVE
GLUCOSE, UA: NEGATIVE mg/dL
KETONES UR: NEGATIVE mg/dL
Leukocytes, UA: NEGATIVE
NITRITE: NEGATIVE
PROTEIN: NEGATIVE mg/dL
Specific Gravity, Urine: 1.02 (ref 1.005–1.030)
UROBILINOGEN UA: 2 mg/dL — AB (ref 0.0–1.0)
pH: 7.5 (ref 5.0–8.0)

## 2017-11-17 LAB — POCT PREGNANCY, URINE: PREG TEST UR: NEGATIVE

## 2017-11-17 MED ORDER — FLUCONAZOLE 150 MG PO TABS: 150.0000 mg | ORAL_TABLET | Freq: Once | ORAL | 0 refills | Status: AC

## 2017-11-17 NOTE — ED Triage Notes (Signed)
Pt c/o vaginal discharge and itchiness since she finished her period a few days ago.

## 2017-11-17 NOTE — Discharge Instructions (Signed)
I am going to go ahead and treat you for yeast, please take Diflucan once, may repeat in 3 days if symptoms still persisting.  We are testing you for Gonorrhea, Chlamydia, Trichomonas, Yeast and Bacterial Vaginosis. We will call you if anything is positive and let you know if you require any further treatment. Please inform partners of any positive results.   Please return if symptoms not improving with treatment, development of fever, nausea, vomiting, abdominal pain.

## 2017-11-17 NOTE — ED Provider Notes (Signed)
[facility]    CSN: [id number] Arrival date & time: 11/17/17  1527     History   Chief Complaint Chief Complaint  Patient presents with  . Vaginal Discharge    HPI Tracy Boyer is a 37 y.o. female no protruding past medical history presenting today for evaluation of vaginal discharge.  Patient states that she has had vaginal discharge and itching for the past 3 to 4 days.  She notices after last menstrual period ended 4 to 5 days ago.  She denies any urinary symptoms of dysuria, increased frequency, urgency.  Denies any fevers, nausea, vomiting, abdominal pain, back pain.  Patient has history of tubal ligation and ablation.  HPI  Past Medical History:  Diagnosis Date  . Allergy   . Anemia   . Anxiety   . HELLP syndrome 2002  . History of abnormal Pap smear 1997  . Preeclampsia 2004  . Pregnancy induced hypertension   . Thrombocytopenia [facility])     Patient Active Problem List   Diagnosis Date Noted  . Forearm fractures, both bones, closed 06/07/2015  . Cesarean delivery, without mention of indication, delivered, with or without mention of antepartum condition 01/12/2013  . Migraine 08/14/2012  . Pregnancy 08/14/2012    Past Surgical History:  Procedure Laterality Date  . CESAREAN SECTION     x4  . CESAREAN SECTION N/A 01/12/2013   Procedure: Repeat cesarean section with delivery of baby boy at 1004. Apgars 8/9. Bilateral tubal ligation.;  Surgeon: Antionette Char, MD;  Location: [facility];  Service: Obstetrics;  Laterality: N/A;  . OPEN REDUCTION INTERNAL FIXATION (ORIF) DISTAL RADIAL FRACTURE Right 06/07/2015   Procedure: OPEN REDUCTION INTERNAL FIXATION RIGHT RADIUS AND ULNA FRACTURES;  Surgeon: Dominica Severin, MD;  Location: [facility];  Service: Orthopedics;  Laterality: Right;  . TOOTH EXTRACTION     x6  . TUBAL LIGATION Bilateral     OB History    Gravida  7   Para  7   Term  4   Preterm  3   AB  0   Living  7     SAB  0   TAB  0   Ectopic  0   Multiple      Live Births  7            Home Medications    Prior to Admission medications   Medication Sig Start Date End Date Taking? Authorizing Provider  Alum & Mag Hydroxide-Simeth (MAGIC MOUTHWASH W/LIDOCAINE) SOLN Take 10 mLs by mouth 4 (four) times daily as needed for mouth pain. 09/20/13   Schorr, Roma Kayser, NP  cyclobenzaprine (FLEXERIL) 10 MG tablet Take 1 tablet (10 mg total) by mouth 3 (three) times daily as needed for muscle spasms. Patient not taking: Reported on 11/17/2017 01/27/17   Street, [place], PA-C  fluconazole (DIFLUCAN) 150 MG tablet Take 1 tablet (150 mg total) by mouth once for 1 dose. 11/17/17 11/17/17  Wieters, Hallie C, PA-C  HYDROcodone-acetaminophen (NORCO/VICODIN) 5-325 MG tablet Take 1 tablet by mouth every 6 (six) hours as needed for moderate pain or severe pain. Patient not taking: Reported on 11/17/2017 08/16/17   Mardella Layman, MD  methocarbamol (ROBAXIN) 500 MG tablet Take 2 tablets (1,000 mg total) by mouth 4 (four) times daily. Patient not taking: Reported on 11/17/2017 01/08/17   Renne Crigler, PA-C  naproxen (NAPROSYN) 500 MG tablet Take 1 tablet (500 mg total) by mouth 2 (two) times daily. Patient not taking: Reported on 11/17/2017  01/08/17   Renne Crigler, PA-C  naproxen (NAPROSYN) 500 MG tablet Take 1 tablet (500 mg total) by mouth 2 (two) times daily as needed for mild pain, moderate pain or headache (TAKE WITH MEALS.). Patient not taking: Reported on 11/17/2017 01/27/17   Street, [place], PA-C    Family History Family History  Problem Relation Age of Onset  . Hypertension Mother     Social History Social History   Tobacco Use  . Smoking status: Former Games developer  . Smokeless tobacco: Never Used  Substance Use Topics  . Alcohol use: No    Frequency: Never  . Drug use: No     Allergies   Patient has no known allergies.   Review of Systems Review of Systems  Constitutional: Negative for fever.  Respiratory: Negative for  shortness of breath.   Cardiovascular: Negative for chest pain.  Gastrointestinal: Negative for abdominal pain, diarrhea, nausea and vomiting.  Genitourinary: Positive for vaginal discharge. Negative for dysuria, flank pain, genital sores, hematuria, menstrual problem, vaginal bleeding and vaginal pain.  Musculoskeletal: Negative for back pain.  Skin: Negative for rash.  Neurological: Negative for dizziness, light-headedness and headaches.     Physical Exam Triage Vital Signs ED Triage Vitals [11/17/17 1626]  Enc Vitals Group     BP 135/80     Pulse Rate 67     Resp 16     Temp 98.2 F (36.8 C)     Temp Source Oral     SpO2 100 %     Weight      Height      Head Circumference      Peak Flow      Pain Score      Pain Loc      Pain Edu?      Excl. in GC?    No data found.  Updated Vital Signs BP 135/80 (BP Location: Right Arm)   Pulse 67   Temp 98.2 F (36.8 C) (Oral)   Resp 16   LMP 11/15/2017   SpO2 100%   Visual Acuity Right Eye Distance:   Left Eye Distance:   Bilateral Distance:    Right Eye Near:   Left Eye Near:    Bilateral Near:     Physical Exam  Constitutional: She appears well-developed and well-nourished. No distress.  HENT:  Head: Normocephalic and atraumatic.  Eyes: Conjunctivae are normal.  Neck: Neck supple.  Cardiovascular: Normal rate and regular rhythm.  No murmur heard. Pulmonary/Chest: Effort normal and breath sounds normal. No respiratory distress.  Abdominal: Soft. There is no tenderness.  Nontender to light and deep palpation  Genitourinary:  Genitourinary Comments: Deferred  Musculoskeletal: She exhibits no edema.  Neurological: She is alert.  Skin: Skin is warm and dry.  Psychiatric: She has a normal mood and affect.  Nursing note and vitals reviewed.    UC Treatments / Results  Labs (all labs ordered are listed, but only abnormal results are displayed) Labs Reviewed  CERVICOVAGINAL ANCILLARY ONLY     EKG None  Radiology No results found.  Procedures Procedures (including critical care time)  Medications Ordered in UC Medications - No data to display  Initial Impression / Assessment and Plan / UC Course  I have reviewed the triage vital signs and the nursing notes.  Pertinent labs & imaging results that were available during my care of the patient were reviewed by me and considered in my medical decision making (see chart for details).  Patient with vaginal discharge and itching after menstrual cycle.  We will go ahead and empirically treat for yeast with Diflucan.  Vaginal swab obtained to check for STDs and to confirm either yeast or BV.  Will call with any positive results and need to alternate treatment. Discussed strict return precautions. Patient verbalized understanding and is agreeable with plan.  Final Clinical Impressions(s) / UC Diagnoses   Final diagnoses:  Vaginal discharge     Discharge Instructions     I am going to go ahead and treat you for yeast, please take Diflucan once, may repeat in 3 days if symptoms still persisting.  We are testing you for Gonorrhea, Chlamydia, Trichomonas, Yeast and Bacterial Vaginosis. We will call you if anything is positive and let you know if you require any further treatment. Please inform partners of any positive results.   Please return if symptoms not improving with treatment, development of fever, nausea, vomiting, abdominal pain.     ED Prescriptions    Medication Sig Dispense Auth. Provider   fluconazole (DIFLUCAN) 150 MG tablet Take 1 tablet (150 mg total) by mouth once for 1 dose. 2 tablet Wieters, Hallie C, PA-C     Controlled Substance Prescriptions [place] Controlled Substance Registry consulted? Not Applicable   Lew Dawes, [place] 11/17/17 2203

## 2017-11-20 LAB — CERVICOVAGINAL ANCILLARY ONLY
Bacterial vaginitis: NEGATIVE
Candida vaginitis: NEGATIVE
Chlamydia: NEGATIVE
Neisseria Gonorrhea: NEGATIVE
TRICH (WINDOWPATH): POSITIVE — AB

## 2017-11-21 ENCOUNTER — Telehealth (HOSPITAL_COMMUNITY): Payer: Self-pay

## 2017-11-21 MED ORDER — METRONIDAZOLE 500 MG PO TABS: 500.0000 mg | ORAL_TABLET | Freq: Two times a day (BID) | ORAL | 0 refills | Status: DC

## 2017-11-21 NOTE — Telephone Encounter (Signed)
Trichomonas is positive. Rx metronidazole 500mg bid x 7d #14 no refills was sent to the pharmacy of record. PT called and made aware.  Educated patient to refrain from sexual intercourse for 7 days to give the medicine time to work. Sexual partners need to be notified and tested/treated. Condoms may reduce risk of reinfection.  Recheck for further evaluation if symptoms are not improving. Pt verbalized understanding.   

## 2018-06-20 ENCOUNTER — Encounter (HOSPITAL_COMMUNITY): Payer: Self-pay | Admitting: Emergency Medicine

## 2018-06-20 ENCOUNTER — Ambulatory Visit (HOSPITAL_COMMUNITY)
Admission: EM | Admit: 2018-06-20 | Discharge: 2018-06-20 | Disposition: A | Discharge status: Home / Self Care | Payer: Medicaid Other | Attending: Family Medicine | Admitting: Family Medicine

## 2018-06-20 DIAGNOSIS — M545 Low back pain, unspecified: Secondary | ICD-10-CM

## 2018-06-20 DIAGNOSIS — S46812A Strain of other muscles, fascia and tendons at shoulder and upper arm level, left arm, initial encounter: Secondary | ICD-10-CM

## 2018-06-20 DIAGNOSIS — G44319 Acute post-traumatic headache, not intractable: Secondary | ICD-10-CM

## 2018-06-20 MED ORDER — CYCLOBENZAPRINE HCL 5 MG PO TABS: 5.0000 mg | ORAL_TABLET | Freq: Three times a day (TID) | ORAL | 0 refills | Status: DC | PRN

## 2018-06-20 MED ORDER — DICLOFENAC SODIUM 75 MG PO TBEC: 75.0000 mg | DELAYED_RELEASE_TABLET | Freq: Two times a day (BID) | ORAL | 0 refills | Status: DC
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
## 2018-11-14 ENCOUNTER — Encounter (HOSPITAL_COMMUNITY): Payer: Self-pay

## 2018-11-14 ENCOUNTER — Ambulatory Visit (HOSPITAL_COMMUNITY)
Admission: EM | Admit: 2018-11-14 | Discharge: 2018-11-14 | Disposition: A | Discharge status: Home / Self Care | Payer: Medicaid Other | Attending: Family Medicine | Admitting: Family Medicine

## 2018-11-14 ENCOUNTER — Other Ambulatory Visit: Payer: Self-pay

## 2018-11-14 DIAGNOSIS — Z711 Person with feared health complaint in whom no diagnosis is made: Secondary | ICD-10-CM | POA: Insufficient documentation

## 2018-11-14 DIAGNOSIS — Z3202 Encounter for pregnancy test, result negative: Secondary | ICD-10-CM

## 2018-11-14 DIAGNOSIS — N898 Other specified noninflammatory disorders of vagina: Secondary | ICD-10-CM

## 2018-11-14 DIAGNOSIS — Z8619 Personal history of other infectious and parasitic diseases: Secondary | ICD-10-CM | POA: Diagnosis not present

## 2018-11-14 DIAGNOSIS — Z113 Encounter for screening for infections with a predominantly sexual mode of transmission: Secondary | ICD-10-CM | POA: Diagnosis not present

## 2018-11-14 LAB — POCT PREGNANCY, URINE: Preg Test, Ur: NEGATIVE

## 2018-11-14 LAB — POCT URINALYSIS DIP (DEVICE)
Bilirubin Urine: NEGATIVE
Glucose, UA: NEGATIVE mg/dL
Hgb urine dipstick: NEGATIVE
Ketones, ur: NEGATIVE mg/dL
Leukocytes,Ua: NEGATIVE
Nitrite: NEGATIVE
Protein, ur: NEGATIVE mg/dL
Specific Gravity, Urine: 1.01 (ref 1.005–1.030)
Urobilinogen, UA: 1 mg/dL (ref 0.0–1.0)
pH: 7 (ref 5.0–8.0)

## 2018-11-14 MED ORDER — AZITHROMYCIN 250 MG PO TABS
ORAL_TABLET | ORAL | Status: AC
  Filled 2018-11-14: qty 4

## 2018-11-14 MED ORDER — METRONIDAZOLE 500 MG PO TABS: 500.0000 mg | ORAL_TABLET | Freq: Two times a day (BID) | ORAL | 0 refills | Status: DC

## 2018-11-14 MED ORDER — CEFTRIAXONE SODIUM 250 MG IJ SOLR
INTRAMUSCULAR | Status: AC
  Filled 2018-11-14: qty 250

## 2018-11-14 MED ORDER — CEFTRIAXONE SODIUM 250 MG IJ SOLR
250.0000 mg | Freq: Once | INTRAMUSCULAR | Status: AC
  Administered 2018-11-14: 20:00:00 250 mg via INTRAMUSCULAR

## 2018-11-14 MED ORDER — AZITHROMYCIN 250 MG PO TABS
1000.0000 mg | ORAL_TABLET | Freq: Once | ORAL | Status: AC
  Administered 2018-11-14: 20:00:00 1000 mg via ORAL

## 2018-11-14 MED ORDER — LIDOCAINE HCL 2 % IJ SOLN
INTRAMUSCULAR | Status: AC
  Filled 2018-11-14: qty 20

## 2018-11-14 NOTE — Discharge Instructions (Addendum)
Urine pregnancy negative Urine did not show sign of infection Vaginal self-swab obtained Given rocephin 250mg  injection and azithromycin 1g in office HIV/ syphilis testing today Prescribed metronidazole 500 mg twice daily for 7 days (do not take while consuming alcohol and/or if breastfeeding) We will follow up with you regarding the results of your test If tests are positive, please abstain from sexual activity for at least 7 days and notify partners Follow up with PCP or with community health if symptoms persists Return here or go to ER if you have any new or worsening symptoms fever, chills, nausea, vomiting, abdominal or pelvic pain, painful intercourse, vaginal discharge, vaginal bleeding, persistent symptoms despite treatment, etc..Marland Kitchen

## 2018-11-14 NOTE — ED Provider Notes (Signed)
[facility]   [id number] 11/14/18 Arrival Time: 1837   KP:[id number] DISCHARGE  SUBJECTIVE:  Tracy Boyer is a 38 y.o. female who presents with complain of tan vaginal discharge and increased urinary frequency that began 3 days ago.  Admits to sexual activity prior to symptoms.  Partner encouraged the patient to "come and get checked."  Patient has been sexually active with 1 female partner over the past 6 months. She has NOT tried OTC medications.  She denies similar symptoms in the past.  Hx of trich in the past.  She denies fever, chills, nausea, vomiting, abdominal or pelvic pain, vaginal itching, vaginal odor, vaginal bleeding, dyspareunia, vaginal rashes or lesions.   Patient's last menstrual period was 11/05/2018.  ROS: As per HPI.  Past Medical History:  Diagnosis Date  . Allergy   . Anemia   . Anxiety   . HELLP syndrome 2002  . History of abnormal Pap smear 1997  . Preeclampsia 2004  . Pregnancy induced hypertension   . Thrombocytopenia ([facility])    Past Surgical History:  Procedure Laterality Date  . CESAREAN SECTION     x4  . CESAREAN SECTION N/A 01/12/2013   Procedure: Repeat cesarean section with delivery of baby boy at 1004. Apgars 8/9. Bilateral tubal ligation.;  Surgeon: Antionette Char, MD;  Location: [facility];  Service: Obstetrics;  Laterality: N/A;  . OPEN REDUCTION INTERNAL FIXATION (ORIF) DISTAL RADIAL FRACTURE Right 06/07/2015   Procedure: OPEN REDUCTION INTERNAL FIXATION RIGHT RADIUS AND ULNA FRACTURES;  Surgeon: Dominica Severin, MD;  Location[facility];  Service: Orthopedics;  Laterality: Right;  . TOOTH EXTRACTION     x6  . TUBAL LIGATION Bilateral    No Known Allergies No current facility-administered medications on file prior to encounter.    Current Outpatient Medications on File Prior to Encounter  Medication Sig Dispense Refill  . cyclobenzaprine (FLEXERIL) 5 MG tablet Take 1 tablet (5 mg total) by mouth 3 (three) times daily as needed for  muscle spasms. 21 tablet 0  . diclofenac (VOLTAREN) 75 MG EC tablet Take 1 tablet (75 mg total) by mouth 2 (two) times daily. 14 tablet 0    Social History   Socioeconomic History  . Marital status: Single    Spouse name: Not on file  . Number of children: Not on file  . Years of education: Not on file  . Highest education level: Not on file  Occupational History  . Not on file  Social Needs  . Financial resource strain: Not on file  . Food insecurity:    Worry: Not on file    Inability: Not on file  . Transportation needs:    Medical: Not on file    Non-medical: Not on file  Tobacco Use  . Smoking status: Former Games developer  . Smokeless tobacco: Never Used  Substance and Sexual Activity  . Alcohol use: No    Frequency: Never  . Drug use: No  . Sexual activity: Yes    Partners: Male  Lifestyle  . Physical activity:    Days per week: Not on file    Minutes per session: Not on file  . Stress: Not on file  Relationships  . Social connections:    Talks on phone: Not on file    Gets together: Not on file    Attends religious service: Not on file    Active member of club or organization: Not on file    Attends meetings of clubs or organizations: Not  on file    Relationship status: Not on file  . Intimate partner violence:    Fear of current or ex partner: Not on file    Emotionally abused: Not on file    Physically abused: Not on file    Forced sexual activity: Not on file  Other Topics Concern  . Not on file  Social History Narrative   ** Merged History Encounter **       Family History  Problem Relation Age of Onset  . Hypertension Mother     OBJECTIVE:  Vitals:   11/14/18 1854  BP: 117/72  Pulse: 70  Resp: 16  Temp: 98.2 F (36.8 C)  TempSrc: Oral  SpO2: 100%     General appearance: Alert, NAD, appears stated age Head: NCAT Throat: lips, mucosa, and tongue normal; teeth and gums normal Lungs: CTA bilaterally without adventitious breath sounds Heart:  regular rate and rhythm.  Radial pulses 2+ symmetrical bilaterally Back: no CVA tenderness Abdomen: soft, non-tender; bowel sounds normal; no guarding GU: deferred Skin: warm and dry Psychological:  Alert and cooperative. Normal mood and affect.   LABS:   Results for orders placed or performed during the hospital encounter of 11/14/18 (from the past 24 hour(s))  POCT urinalysis dip (device)     Status: None   Collection Time: 11/14/18  7:24 PM  Result Value Ref Range   Glucose, UA NEGATIVE NEGATIVE mg/dL   Bilirubin Urine NEGATIVE NEGATIVE   Ketones, ur NEGATIVE NEGATIVE mg/dL   Specific Gravity, Urine 1.010 1.005 - 1.030   Hgb urine dipstick NEGATIVE NEGATIVE   pH 7.0 5.0 - 8.0   Protein, ur NEGATIVE NEGATIVE mg/dL   Urobilinogen, UA 1.0 0.0 - 1.0 mg/dL   Nitrite NEGATIVE NEGATIVE   Leukocytes,Ua NEGATIVE NEGATIVE  Pregnancy, urine POC     Status: None   Collection Time: 11/14/18  7:35 PM  Result Value Ref Range   Preg Test, Ur NEGATIVE NEGATIVE    ASSESSMENT & PLAN:  1. Vaginal discharge   2. Concern about STD in female without diagnosis     Meds ordered this encounter  Medications  . cefTRIAXone (ROCEPHIN) injection 250 mg    Order Specific Question:   Antibiotic Indication:    Answer:   STD  . azithromycin (ZITHROMAX) tablet 1,000 mg    Pending: Labs Reviewed  RPR  HIV ANTIBODY (ROUTINE TESTING W REFLEX)  POC URINE PREG, ED  POCT URINALYSIS DIP (DEVICE)  POCT PREGNANCY, URINE  CERVICOVAGINAL ANCILLARY ONLY   Urine pregnancy negative Urine did not show sign of infection Vaginal self-swab obtained Given rocephin 250mg  injection and azithromycin 1g in office HIV/ syphilis testing today Prescribed metronidazole 500 mg twice daily for 7 days (do not take while consuming alcohol and/or if breastfeeding) We will follow up with you regarding the results of your test If tests are positive, please abstain from sexual activity for at least 7 days and notify  partners Follow up with PCP or with community health if symptoms persists Return here or go to ER if you have any new or worsening symptoms fever, chills, nausea, vomiting, abdominal or pelvic pain, painful intercourse, vaginal discharge, vaginal bleeding, persistent symptoms despite treatment, etc...  Reviewed expectations re: course of current medical issues. Questions answered. Outlined signs and symptoms indicating need for more acute intervention. Patient verbalized understanding. After Visit Summary given.       Rennis HardingWurst, Idolina Mantell, PA-C 11/14/18 2043

## 2018-11-14 NOTE — ED Triage Notes (Signed)
Patient presents to Urgent Care with complaints of tan-colored vaginal discharge and frequent urination since 2-3 days ago. Patient denies vaginal itching or burning with urination.

## 2018-11-15 ENCOUNTER — Telehealth (HOSPITAL_COMMUNITY): Payer: Self-pay | Admitting: Emergency Medicine

## 2018-11-15 LAB — CERVICOVAGINAL ANCILLARY ONLY
Bacterial vaginitis: POSITIVE — AB
Candida vaginitis: NEGATIVE
Chlamydia: POSITIVE — AB
Neisseria Gonorrhea: NEGATIVE
Trichomonas: POSITIVE — AB

## 2018-11-15 LAB — HIV ANTIBODY (ROUTINE TESTING W REFLEX): HIV Screen 4th Generation wRfx: NONREACTIVE

## 2018-11-15 LAB — RPR: RPR Ser Ql: NONREACTIVE

## 2018-11-15 NOTE — Telephone Encounter (Signed)
Bacterial Vaginosis test is positive.  Prescription for metronidazole was given at the urgent care visit.   Trichomonas is positive. Rx metronidazole was given at the urgent care visit. Pt needs education to please refrain from sexual intercourse for 7 days to give the medicine time to work. Sexual partners need to be notified and tested/treated. Condoms may reduce risk of reinfection. Recheck for further evaluation if symptoms are not improving.   Chlamydia is positive.  This was treated at the urgent care visit with po zithromax 1g.  Pt needs education to please refrain from sexual intercourse for 7 days to give the medicine time to work.  Sexual partners need to be notified and tested/treated.  Condoms may reduce risk of reinfection.  Recheck or followup with PCP for further evaluation if symptoms are not improving.  [facility] notified.  Patient contacted and made aware of all results, all questions answered.

## 2018-12-14 ENCOUNTER — Encounter (HOSPITAL_COMMUNITY): Payer: Self-pay

## 2018-12-14 ENCOUNTER — Ambulatory Visit (HOSPITAL_COMMUNITY)
Admission: EM | Admit: 2018-12-14 | Discharge: 2018-12-14 | Disposition: A | Discharge status: Home / Self Care | Payer: Medicaid Other | Attending: Family Medicine | Admitting: Family Medicine

## 2018-12-14 ENCOUNTER — Other Ambulatory Visit: Payer: Self-pay

## 2018-12-14 DIAGNOSIS — N760 Acute vaginitis: Secondary | ICD-10-CM | POA: Diagnosis present

## 2018-12-14 MED ORDER — ONDANSETRON 4 MG PO TBDP
4.0000 mg | ORAL_TABLET | Freq: Once | ORAL | Status: AC
  Administered 2018-12-14: 4 mg via ORAL

## 2018-12-14 MED ORDER — METRONIDAZOLE 500 MG PO TABS
ORAL_TABLET | ORAL | Status: AC
  Filled 2018-12-14: qty 4

## 2018-12-14 MED ORDER — METRONIDAZOLE 500 MG PO TABS
2000.0000 mg | ORAL_TABLET | Freq: Once | ORAL | Status: AC
  Administered 2018-12-14: 13:00:00 2000 mg via ORAL

## 2018-12-14 MED ORDER — ONDANSETRON 4 MG PO TBDP
ORAL_TABLET | ORAL | Status: AC
  Filled 2018-12-14: qty 1

## 2018-12-14 NOTE — ED Provider Notes (Signed)
[facility]    CSN: [id number] Arrival date & time: 12/14/18  1232     History   Chief Complaint Chief Complaint  Patient presents with  . Exposure to STD    HPI Tracy Boyer is a 38 y.o. female.   Tracy Boyer presents with complaints of persistent vaginal itching. She was seen here 4/29, treated with rocephin, azithromycin and 1 week course of flagyl. She tested positive for trichomonas, chlamydia and bv. She completed only 2 days of flagyl before she lost the rest of the pills. She has been sexually active with the same partner since then. He was treated last week. They have since been using condoms. She has occasional vaginal spotting. Her tubes have been tied. No urinary symptoms. No fevers or pelvic pain.    ROS per HPI, negative if not otherwise mentioned.      Past Medical History:  Diagnosis Date  . Allergy   . Anemia   . Anxiety   . HELLP syndrome 2002  . History of abnormal Pap smear 1997  . Preeclampsia 2004  . Pregnancy induced hypertension   . Thrombocytopenia [facility])     Patient Active Problem List   Diagnosis Date Noted  . Forearm fractures, both bones, closed 06/07/2015  . Cesarean delivery, without mention of indication, delivered, with or without mention of antepartum condition 01/12/2013  . Migraine 08/14/2012  . Pregnancy 08/14/2012    Past Surgical History:  Procedure Laterality Date  . CESAREAN SECTION     x4  . CESAREAN SECTION N/A 01/12/2013   Procedure: Repeat cesarean section with delivery of baby boy at 1004. Apgars 8/9. Bilateral tubal ligation.;  Surgeon: Antionette Char, MD;  Location: [facility];  Service: Obstetrics;  Laterality: N/A;  . OPEN REDUCTION INTERNAL FIXATION (ORIF) DISTAL RADIAL FRACTURE Right 06/07/2015   Procedure: OPEN REDUCTION INTERNAL FIXATION RIGHT RADIUS AND ULNA FRACTURES;  Surgeon: Dominica Severin, MD;  Location[facility];  Service: Orthopedics;  Laterality: Right;  . TOOTH EXTRACTION     x6   . TUBAL LIGATION Bilateral     OB History    Gravida  7   Para  7   Term  4   Preterm  3   AB  0   Living  7     SAB  0   TAB  0   Ectopic  0   Multiple      Live Births  7            Home Medications    Prior to Admission medications   Medication Sig Start Date End Date Taking? Authorizing Provider  metroNIDAZOLE (FLAGYL) 500 MG tablet Take 1 tablet (500 mg total) by mouth 2 (two) times daily. 11/14/18   Rennis Harding, PA-C    Family History Family History  Problem Relation Age of Onset  . Hypertension Mother   . Healthy Paternal Grandmother     Social History Social History   Tobacco Use  . Smoking status: Former Games developer  . Smokeless tobacco: Never Used  Substance Use Topics  . Alcohol use: No    Frequency: Never  . Drug use: No     Allergies   Patient has no known allergies.   Review of Systems Review of Systems   Physical Exam Triage Vital Signs ED Triage Vitals  Enc Vitals Group     BP 12/14/18 1248 118/86     Pulse Rate 12/14/18 1248 65     Resp 12/14/18  1248 19     Temp 12/14/18 1248 97.8 F (36.6 C)     Temp Source 12/14/18 1248 Oral     SpO2 12/14/18 1248 100 %     Weight --      Height --      Head Circumference --      Peak Flow --      Pain Score 12/14/18 1247 0     Pain Loc --      Pain Edu? --      Excl. in GC? --    No data found.  Updated Vital Signs BP 118/86 (BP Location: Left Arm)   Pulse 65   Temp 97.8 F (36.6 C) (Oral)   Resp 19   SpO2 100%   Visual Acuity Right Eye Distance:   Left Eye Distance:   Bilateral Distance:    Right Eye Near:   Left Eye Near:    Bilateral Near:     Physical Exam Constitutional:      General: She is not in acute distress.    Appearance: She is well-developed.  Cardiovascular:     Rate and Rhythm: Normal rate and regular rhythm.     Heart sounds: Normal heart sounds.  Pulmonary:     Effort: Pulmonary effort is normal.     Breath sounds: Normal breath  sounds.  Abdominal:     Palpations: Abdomen is soft. Abdomen is not rigid.     Tenderness: There is no abdominal tenderness. There is no guarding or rebound.  Genitourinary:    Comments: Denies sores, lesions, vaginal bleeding; no pelvic pain; gu exam deferred at this time, vaginal self swab collected.   Skin:    General: Skin is warm and dry.  Neurological:     Mental Status: She is alert and oriented to person, place, and time.      UC Treatments / Results  Labs (all labs ordered are listed, but only abnormal results are displayed) Labs Reviewed  CERVICOVAGINAL ANCILLARY ONLY    EKG None  Radiology No results found.  Procedures Procedures (including critical care time)  Medications Ordered in UC Medications  metroNIDAZOLE (FLAGYL) tablet 2,000 mg (2,000 mg Oral Given 12/14/18 1319)  ondansetron (ZOFRAN-ODT) disintegrating tablet 4 mg (4 mg Oral Given 12/14/18 1320)    Initial Impression / Assessment and Plan / UC Course  I have reviewed the triage vital signs and the nursing notes.  Pertinent labs & imaging results that were available during my care of the patient were reviewed by me and considered in my medical decision making (see chart for details).     2g of flagyl provided today. Repeat self vaginal swab collected. Encouraged safe sex and for partner recheck. Will notify of any positive findings and if any changes to treatment are needed.  Patient verbalized understanding and agreeable to plan.   Final Clinical Impressions(s) / UC Diagnoses   Final diagnoses:  Acute vaginitis     Discharge Instructions     We will treat you with a one time dose today for trichomonas and BV.  You partner should still be retested since you've had intercourse since his treatment.  Please withhold from intercourse for the next week. Please use condoms to prevent STD's.   If symptoms worsen or do not improve in the next week to return to be seen or to follow up with your PCP.      ED Prescriptions    None     Controlled Substance Prescriptions  [place] Controlled Substance Registry consulted? Not Applicable   Georgetta Haber, NP 12/14/18 1348

## 2018-12-14 NOTE — Discharge Instructions (Signed)
We will treat you with a one time dose today for trichomonas and BV.  You partner should still be retested since you've had intercourse since his treatment.  Please withhold from intercourse for the next week. Please use condoms to prevent STD's.   If symptoms worsen or do not improve in the next week to return to be seen or to follow up with your PCP.
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
## 2018-12-17 LAB — CERVICOVAGINAL ANCILLARY ONLY
Bacterial vaginitis: POSITIVE — AB
Candida vaginitis: POSITIVE — AB
Chlamydia: NEGATIVE
Neisseria Gonorrhea: POSITIVE — AB
Trichomonas: POSITIVE — AB

## 2018-12-24 ENCOUNTER — Telehealth (HOSPITAL_COMMUNITY): Payer: Self-pay | Admitting: Emergency Medicine

## 2018-12-24 MED ORDER — METRONIDAZOLE 500 MG PO TABS: 500.0000 mg | ORAL_TABLET | Freq: Two times a day (BID) | ORAL | 0 refills | Status: AC

## 2018-12-24 MED ORDER — FLUCONAZOLE 150 MG PO TABS: 150.0000 mg | ORAL_TABLET | Freq: Once | ORAL | 0 refills | Status: AC

## 2018-12-24 NOTE — Telephone Encounter (Signed)
Bacterial vaginosis is positive. This was not treated at the urgent care visit.  Flagyl 500 mg BID x 7 days #14 no refills sent to patients pharmacy of choice.    Test for candida (yeast) was positive.  Prescription for fluconazole 150mg  po now, repeat dose in 3d if needed, #2 no refills, sent to the pharmacy of record.  Recheck or followup with PCP for further evaluation if symptoms are not improving.    Gonorrhea is positive.  Patient should return as soon as possible to the urgent care for treatment with IM rocephin 250mg  and po zithromax 1g. Patient will not need to see a provider unless there are new symptoms she would like evaluated. Pt needs education to refrain from sexual intercourse for now and for 7 days after treatment to give the medicine time to work. Sexual partners need to be notified and tested/treated. Condoms may reduce risk of reinfection. [facility] notified.   Trichomonas is positive. Rx metronidazole was given at the urgent care visit. Pt needs education to please refrain from sexual intercourse for 7 days to give the medicine time to work. Sexual partners need to be notified and tested/treated. Condoms may reduce risk of reinfection. Recheck for further evaluation if symptoms are not improving.   Patient contacted and made aware of all results, all questions answered. Will return today for treatment.

## 2018-12-25 ENCOUNTER — Telehealth (HOSPITAL_COMMUNITY): Payer: Self-pay | Admitting: Emergency Medicine

## 2018-12-25 NOTE — Telephone Encounter (Signed)
Pt still has not returned for treatment, spoke to her again on the phone today, she will try to return today.

## 2019-01-02 ENCOUNTER — Emergency Department (HOSPITAL_COMMUNITY)
Admission: EM | Admit: 2019-01-02 | Discharge: 2019-01-02 | Disposition: A | Discharge status: Home / Self Care | Payer: Medicaid Other | Attending: Emergency Medicine | Admitting: Emergency Medicine

## 2019-01-02 ENCOUNTER — Encounter (HOSPITAL_COMMUNITY): Payer: Self-pay

## 2019-01-02 ENCOUNTER — Emergency Department (HOSPITAL_COMMUNITY): Payer: Medicaid Other

## 2019-01-02 DIAGNOSIS — M79661 Pain in right lower leg: Secondary | ICD-10-CM | POA: Insufficient documentation

## 2019-01-02 DIAGNOSIS — Z5321 Procedure and treatment not carried out due to patient leaving prior to being seen by health care provider: Secondary | ICD-10-CM | POA: Insufficient documentation

## 2019-01-02 DIAGNOSIS — M545 Low back pain: Secondary | ICD-10-CM | POA: Diagnosis present

## 2019-01-02 NOTE — ED Notes (Signed)
Called for room 
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
## 2019-09-30 ENCOUNTER — Emergency Department (HOSPITAL_COMMUNITY): Payer: Medicaid Other

## 2019-09-30 ENCOUNTER — Emergency Department (HOSPITAL_COMMUNITY)
Admission: EM | Admit: 2019-09-30 | Discharge: 2019-10-01 | Disposition: A | Discharge status: Home / Self Care | Payer: Medicaid Other | Attending: Emergency Medicine | Admitting: Emergency Medicine

## 2019-09-30 ENCOUNTER — Encounter (HOSPITAL_COMMUNITY): Payer: Self-pay | Admitting: Emergency Medicine

## 2019-09-30 ENCOUNTER — Other Ambulatory Visit: Payer: Self-pay

## 2019-09-30 DIAGNOSIS — M5489 Other dorsalgia: Secondary | ICD-10-CM | POA: Diagnosis present

## 2019-09-30 DIAGNOSIS — Z5321 Procedure and treatment not carried out due to patient leaving prior to being seen by health care provider: Secondary | ICD-10-CM | POA: Diagnosis not present
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
## 2019-10-01 ENCOUNTER — Ambulatory Visit (HOSPITAL_COMMUNITY)
Admission: EM | Admit: 2019-10-01 | Discharge: 2019-10-01 | Disposition: A | Discharge status: Home / Self Care | Payer: Medicaid Other | Attending: Family Medicine | Admitting: Family Medicine

## 2019-10-01 ENCOUNTER — Other Ambulatory Visit: Payer: Self-pay

## 2019-10-01 ENCOUNTER — Encounter (HOSPITAL_COMMUNITY): Payer: Self-pay

## 2019-10-01 DIAGNOSIS — M542 Cervicalgia: Secondary | ICD-10-CM | POA: Diagnosis not present

## 2019-10-01 DIAGNOSIS — S161XXA Strain of muscle, fascia and tendon at neck level, initial encounter: Secondary | ICD-10-CM | POA: Diagnosis not present

## 2019-10-01 DIAGNOSIS — M7918 Myalgia, other site: Secondary | ICD-10-CM

## 2019-10-01 MED ORDER — IBUPROFEN 800 MG PO TABS: 800.0000 mg | ORAL_TABLET | Freq: Three times a day (TID) | ORAL | 0 refills | Status: DC | PRN

## 2019-10-01 MED ORDER — METHOCARBAMOL 500 MG PO TABS: 500.0000 mg | ORAL_TABLET | Freq: Two times a day (BID) | ORAL | 0 refills | Status: DC

## 2019-10-01 MED ORDER — ACETAMINOPHEN 325 MG PO TABS: 650.0000 mg | ORAL_TABLET | Freq: Four times a day (QID) | ORAL | 0 refills | Status: AC | PRN

## 2019-10-01 NOTE — Discharge Instructions (Signed)
Take the ibuprofen as prescribed.    Follow up with your primary care provider or an orthopedist if you symptoms continue or worsen;  Or if you develop new symptoms, such as numbness, tingling, or weakness.
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
## 2019-10-06 ENCOUNTER — Other Ambulatory Visit: Payer: Self-pay

## 2019-10-06 ENCOUNTER — Encounter (HOSPITAL_COMMUNITY): Payer: Self-pay

## 2019-10-06 ENCOUNTER — Ambulatory Visit (HOSPITAL_COMMUNITY)
Admission: EM | Admit: 2019-10-06 | Discharge: 2019-10-06 | Disposition: A | Discharge status: Home / Self Care | Payer: Medicaid Other | Attending: Emergency Medicine | Admitting: Emergency Medicine

## 2019-10-06 DIAGNOSIS — S46811D Strain of other muscles, fascia and tendons at shoulder and upper arm level, right arm, subsequent encounter: Secondary | ICD-10-CM

## 2019-10-06 MED ORDER — MELOXICAM 7.5 MG PO TABS: 7.5000 mg | ORAL_TABLET | Freq: Every day | ORAL | 0 refills | Status: DC
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
## 2019-10-06 NOTE — Discharge Instructions (Signed)
Heat, massage and light stretching to help with the spasms of the involved muscles.  The muscle relaxer as previously prescribed to help with pain.  Stop ibuprofen. Once a day take meloxicam to help with pain. Take with food.  Please follow up with your primary care provider and/ or sports medicine as you may need further treatment or referral if symptoms persist.

## 2019-11-12 ENCOUNTER — Ambulatory Visit (HOSPITAL_COMMUNITY)
Admission: EM | Admit: 2019-11-12 | Discharge: 2019-11-12 | Disposition: A | Discharge status: Home / Self Care | Payer: Medicaid Other | Attending: Family Medicine | Admitting: Family Medicine

## 2019-11-12 ENCOUNTER — Other Ambulatory Visit: Payer: Self-pay

## 2019-11-12 DIAGNOSIS — J0100 Acute maxillary sinusitis, unspecified: Secondary | ICD-10-CM | POA: Diagnosis not present

## 2019-11-12 MED ORDER — DEXAMETHASONE SODIUM PHOSPHATE 10 MG/ML IJ SOLN
INTRAMUSCULAR | Status: AC
  Filled 2019-11-12: qty 1

## 2019-11-12 MED ORDER — AMOXICILLIN-POT CLAVULANATE 875-125 MG PO TABS: 1.0000 | ORAL_TABLET | Freq: Two times a day (BID) | ORAL | 0 refills | Status: DC

## 2019-11-12 MED ORDER — FLUTICASONE PROPIONATE 50 MCG/ACT NA SUSP: 1.0000 | Freq: Every day | NASAL | 2 refills | Status: DC

## 2019-11-12 MED ORDER — DEXAMETHASONE SODIUM PHOSPHATE 10 MG/ML IJ SOLN
10.0000 mg | Freq: Once | INTRAMUSCULAR | Status: AC
  Administered 2019-11-12: 10:00:00 10 mg via INTRAMUSCULAR

## 2019-11-12 NOTE — ED Triage Notes (Signed)
Pt c/o facial swelling x 2 days. Benadryl made better yesterday but woke up with swelling again this AM.

## 2019-11-12 NOTE — Discharge Instructions (Addendum)
Treating you for a sinus infection. Steroid injection given here today.  Sending some antibiotics to the pharmacy. Make sure you are taking the allergy pill daily. Flonase nasal spray daily Follow up as needed for continued or worsening symptoms

## 2019-11-12 NOTE — ED Provider Notes (Signed)
[place]    CSN: [id number] Arrival date & time: 11/12/19  0854      History   Chief Complaint Chief Complaint  Patient presents with  . Facial Swelling    HPI Tracy Boyer is a 39 y.o. female.   Patient is a 39 year old female with past medical history of allergy, anemia, anxiety.  She presents today with facial tenderness, swelling, nasal congestion, purulent mucus.  Symptoms have been constant and worsening.  She has been using Allegra, Benadryl and Afrin with no relief.  Started after being outside after her son mowed the yard. Some associated right ear pain. No fever, cough, congestion, sore throat.   ROS per HPI      Past Medical History:  Diagnosis Date  . Allergy   . Anemia   . Anxiety   . HELLP syndrome 2002  . History of abnormal Pap smear 1997  . Preeclampsia 2004  . Pregnancy induced hypertension   . Thrombocytopenia [facility])     Patient Active Problem List   Diagnosis Date Noted  . Forearm fractures, both bones, closed 06/07/2015  . Cesarean delivery, without mention of indication, delivered, with or without mention of antepartum condition 01/12/2013  . Migraine 08/14/2012  . Pregnancy 08/14/2012    Past Surgical History:  Procedure Laterality Date  . CESAREAN SECTION     x4  . CESAREAN SECTION N/A 01/12/2013   Procedure: Repeat cesarean section with delivery of baby boy at 1004. Apgars 8/9. Bilateral tubal ligation.;  Surgeon: Lahoma Crocker, MD;  Location: [place] [facility];  Service: Obstetrics;  Laterality: N/A;  . OPEN REDUCTION INTERNAL FIXATION (ORIF) DISTAL RADIAL FRACTURE Right 06/07/2015   Procedure: OPEN REDUCTION INTERNAL FIXATION RIGHT RADIUS AND ULNA FRACTURES;  Surgeon: Roseanne Kaufman, MD;  Location: [place];  Service: Orthopedics;  Laterality: Right;  . TOOTH EXTRACTION     x6  . TUBAL LIGATION Bilateral     OB History    Gravida  7   Para  7   Term  4   Preterm  3   AB  0   Living  7     SAB  0   TAB  0     Ectopic  0   Multiple      Live Births  7            Home Medications    Prior to Admission medications   Medication Sig Start Date End Date Taking? Authorizing Provider  acetaminophen (TYLENOL) 325 MG tablet Take 2 tablets (650 mg total) by mouth every 6 (six) hours as needed. 10/01/19   Faustino Congress, NP  amoxicillin-clavulanate (AUGMENTIN) 875-125 MG tablet Take 1 tablet by mouth every 12 (twelve) hours. 11/12/19   Heidee Audi, Tressia Miners A, NP  fluticasone (FLONASE) 50 MCG/ACT nasal spray Place 1 spray into both nostrils daily. 11/12/19   Loura Halt A, NP  meloxicam (MOBIC) 7.5 MG tablet Take 1 tablet (7.5 mg total) by mouth daily. 10/06/19   Zigmund Gottron, NP  methocarbamol (ROBAXIN) 500 MG tablet Take 1 tablet (500 mg total) by mouth 2 (two) times daily. 10/01/19   Faustino Congress, NP    Family History Family History  Problem Relation Age of Onset  . Hypertension Mother   . Healthy Paternal Grandmother     Social History Social History   Tobacco Use  . Smoking status: Former Research scientist (life sciences)  . Smokeless tobacco: Never Used  Substance Use Topics  . Alcohol use: No  . Drug  use: No     Allergies   Aspirin   Review of Systems Review of Systems   Physical Exam Triage Vital Signs ED Triage Vitals  Enc Vitals Group     BP 11/12/19 0926 (!) 145/102     Pulse Rate 11/12/19 0926 88     Resp 11/12/19 0926 16     Temp 11/12/19 0926 (!) 97.2 F (36.2 C)     Temp src --      SpO2 11/12/19 0926 98 %     Weight --      Height --      Head Circumference --      Peak Flow --      Pain Score 11/12/19 0927 0     Pain Loc --      Pain Edu? --      Excl. in GC? --    No data found.  Updated Vital Signs BP (!) 145/102   Pulse 88   Temp (!) 97.2 F (36.2 C)   Resp 16   LMP 10/20/2019   SpO2 98%   Visual Acuity Right Eye Distance:   Left Eye Distance:   Bilateral Distance:    Right Eye Near:   Left Eye Near:    Bilateral Near:     Physical Exam Vitals  and nursing note reviewed.  Constitutional:      General: She is not in acute distress.    Appearance: Normal appearance. She is not ill-appearing, toxic-appearing or diaphoretic.  HENT:     Head: Normocephalic.      Right Ear: There is impacted cerumen.     Left Ear: Tympanic membrane and ear canal normal.     Nose: Nasal tenderness, mucosal edema, congestion and rhinorrhea present.     Right Turbinates: Swollen.     Left Turbinates: Swollen.     Right Sinus: Maxillary sinus tenderness present.     Left Sinus: Maxillary sinus tenderness present.     Comments: Mild right facial swelling Inside of mouth appears normal Eyes:     Conjunctiva/sclera: Conjunctivae normal.  Pulmonary:     Effort: Pulmonary effort is normal.  Abdominal:     Palpations: Abdomen is soft.  Musculoskeletal:        General: Normal range of motion.     Cervical back: Normal range of motion.  Skin:    General: Skin is warm and dry.     Findings: No rash.  Neurological:     Mental Status: She is alert.  Psychiatric:        Mood and Affect: Mood normal.      UC Treatments / Results  Labs (all labs ordered are listed, but only abnormal results are displayed) Labs Reviewed - No data to display  EKG   Radiology No results found.  Procedures Procedures (including critical care time)  Medications Ordered in UC Medications  dexamethasone (DECADRON) injection 10 mg (10 mg Intramuscular Given 11/12/19 1008)    Initial Impression / Assessment and Plan / UC Course  I have reviewed the triage vital signs and the nursing notes.  Pertinent labs & imaging results that were available during my care of the patient were reviewed by me and considered in my medical decision making (see chart for details).     Sinusitis Treating for severe sinus infection with Augmentin Recommended continue the daily antihistamine Steroid injection given here for sinus inflammation, facial swelling Strict ER and return  precautions given Final Clinical Impressions(s) / UC  Diagnoses   Final diagnoses:  Acute non-recurrent maxillary sinusitis     Discharge Instructions     Treating you for a sinus infection. Steroid injection given here today.  Sending some antibiotics to the pharmacy. Make sure you are taking the allergy pill daily. Flonase nasal spray daily Follow up as needed for continued or worsening symptoms        ED Prescriptions    Medication Sig Dispense Auth. Provider   amoxicillin-clavulanate (AUGMENTIN) 875-125 MG tablet Take 1 tablet by mouth every 12 (twelve) hours. 14 tablet [place] Yepiz A, NP   fluticasone (FLONASE) 50 MCG/ACT nasal spray Place 1 spray into both nostrils daily. 16 g Dahlia Byes A, NP     PDMP not reviewed this encounter.   Janace Aris, NP 11/12/19 1014

## 2021-06-15 ENCOUNTER — Encounter (HOSPITAL_COMMUNITY): Payer: Self-pay | Admitting: Emergency Medicine

## 2021-06-15 ENCOUNTER — Telehealth (HOSPITAL_COMMUNITY): Payer: Self-pay | Admitting: Student

## 2021-06-15 ENCOUNTER — Ambulatory Visit (HOSPITAL_COMMUNITY)
Admission: EM | Admit: 2021-06-15 | Discharge: 2021-06-15 | Disposition: A | Discharge status: Home / Self Care | Payer: Medicaid Other | Attending: Emergency Medicine | Admitting: Emergency Medicine

## 2021-06-15 ENCOUNTER — Other Ambulatory Visit: Payer: Self-pay

## 2021-06-15 DIAGNOSIS — B309 Viral conjunctivitis, unspecified: Secondary | ICD-10-CM | POA: Diagnosis not present

## 2021-06-15 DIAGNOSIS — H1031 Unspecified acute conjunctivitis, right eye: Secondary | ICD-10-CM

## 2021-06-15 MED ORDER — ERYTHROMYCIN 5 MG/GM OP OINT: TOPICAL_OINTMENT | OPHTHALMIC | 0 refills | Status: DC

## 2021-06-15 MED ORDER — OLOPATADINE HCL 0.2 % OP SOLN: 1.0000 [drp] | Freq: Every day | OPHTHALMIC | 0 refills | Status: DC

## 2021-06-15 NOTE — Telephone Encounter (Signed)
Patient has conjunctivitis, erythromycin ointment prescribed.

## 2021-06-15 NOTE — ED Triage Notes (Signed)
Pt is present today with right red eye irritation. Pt states that her sx started last night

## 2021-06-15 NOTE — Discharge Instructions (Addendum)
Use warm compresses to your right eye several times a day with a clean washcloth each time. Use over the counter medicines for your nasal congestion such as saline nasal spray and Flonase nasal spray.

## 2021-06-15 NOTE — ED Provider Notes (Signed)
[facility]    CSN: [id number] Arrival date & time: 06/15/21  1510      History   Chief Complaint Chief Complaint  Patient presents with   Conjunctivitis    HPI BIRDIE FETTY is a 40 y.o. female. Yesterday pt developed a red, itchy R eye along with nasal congestion and rhinorrhea. Denies purulent eye discharge. Reports her eye feels like something could be in it.    Conjunctivitis   Past Medical History:  Diagnosis Date   Allergy    Anemia    Anxiety    HELLP syndrome 2002   History of abnormal Pap smear 1997   Preeclampsia 2004   Pregnancy induced hypertension    Thrombocytopenia [facility])     Patient Active Problem List   Diagnosis Date Noted   Forearm fractures, both bones, closed 06/07/2015   Cesarean delivery, without mention of indication, delivered, with or without mention of antepartum condition 01/12/2013   Migraine 08/14/2012   Pregnancy 08/14/2012    Past Surgical History:  Procedure Laterality Date   CESAREAN SECTION     x4   CESAREAN SECTION N/A 01/12/2013   Procedure: Repeat cesarean section with delivery of baby boy at 1004. Apgars 8/9. Bilateral tubal ligation.;  Surgeon: Antionette Char, MD;  Location: [facility];  Service: Obstetrics;  Laterality: N/A;   OPEN REDUCTION INTERNAL FIXATION (ORIF) DISTAL RADIAL FRACTURE Right 06/07/2015   Procedure: OPEN REDUCTION INTERNAL FIXATION RIGHT RADIUS AND ULNA FRACTURES;  Surgeon: Dominica Severin, MD;  Location: [facility];  Service: Orthopedics;  Laterality: Right;   TOOTH EXTRACTION     x6   TUBAL LIGATION Bilateral     OB History     Gravida  7   Para  7   Term  4   Preterm  3   AB  0   Living  7      SAB  0   IAB  0   Ectopic  0   Multiple      Live Births  7            Home Medications    Prior to Admission medications   Medication Sig Start Date End Date Taking? Authorizing Provider  Olopatadine HCl 0.2 % SOLN Place 1 drop into the right eye daily. 06/15/21   Yes Cathlyn Parsons, NP  acetaminophen (TYLENOL) 325 MG tablet Take 2 tablets (650 mg total) by mouth every 6 (six) hours as needed. 10/01/19   Moshe Cipro, NP  amoxicillin-clavulanate (AUGMENTIN) 875-125 MG tablet Take 1 tablet by mouth every 12 (twelve) hours. 11/12/19   Dahlia Byes A, NP  erythromycin ophthalmic ointment Place a 1/2 inch ribbon of ointment into the lower eyelid. 06/15/21   Theron Arista, PA-C  erythromycin ophthalmic ointment Place a 1/2 inch ribbon of ointment into the lower eyelid. 06/15/21   Theron Arista, PA-C  fluticasone (FLONASE) 50 MCG/ACT nasal spray Place 1 spray into both nostrils daily. 11/12/19   Dahlia Byes A, NP  meloxicam (MOBIC) 7.5 MG tablet Take 1 tablet (7.5 mg total) by mouth daily. 10/06/19   Georgetta Haber, NP  methocarbamol (ROBAXIN) 500 MG tablet Take 1 tablet (500 mg total) by mouth 2 (two) times daily. 10/01/19   Moshe Cipro, NP    Family History Family History  Problem Relation Age of Onset   Hypertension Mother    Healthy Paternal Grandmother     Social History Social History   Tobacco Use   Smoking status: Former  Smokeless tobacco: Never  Vaping Use   Vaping Use: Never used  Substance Use Topics   Alcohol use: No   Drug use: No     Allergies   Aspirin   Review of Systems Review of Systems  Constitutional:  Negative for fever.  HENT:  Positive for congestion and rhinorrhea.   Eyes:  Positive for redness and itching. Negative for photophobia, discharge and visual disturbance.  Respiratory:  Negative for cough.     Physical Exam Triage Vital Signs ED Triage Vitals  Enc Vitals Group     BP 06/15/21 1601 135/83     Pulse Rate 06/15/21 1601 70     Resp 06/15/21 1601 18     Temp 06/15/21 1601 98.5 F (36.9 C)     Temp Source 06/15/21 1601 Oral     SpO2 06/15/21 1601 98 %     Weight --      Height --      Head Circumference --      Peak Flow --      Pain Score 06/15/21 1558 5     Pain Loc --      Pain  Edu? --      Excl. in [place]? --    No data found.  Updated Vital Signs BP 135/83 (BP Location: Right Arm)   Pulse 70   Temp 98.5 F (36.9 C) (Oral)   Resp 18   SpO2 98%   Visual Acuity Right Eye Distance:   Left Eye Distance:   Bilateral Distance:    Right Eye Near:   Left Eye Near:    Bilateral Near:     Physical Exam Constitutional:      Appearance: Normal appearance. She is not ill-appearing.  HENT:     Nose: Congestion present.  Eyes:     General: Lids are normal. Lids are everted, no foreign bodies appreciated.        Right eye: No foreign body, discharge or hordeolum.     Conjunctiva/sclera:     Right eye: Right conjunctiva is injected. No chemosis, exudate or hemorrhage.    Left eye: Left conjunctiva is not injected. No chemosis, exudate or hemorrhage.    Pupils: Pupils are equal, round, and reactive to light.     Right eye: No corneal abrasion.  Pulmonary:     Effort: Pulmonary effort is normal.  Neurological:     Mental Status: She is alert.     UC Treatments / Results  Labs (all labs ordered are listed, but only abnormal results are displayed) Labs Reviewed - No data to display  EKG   Radiology No results found.  Procedures Procedures (including critical care time)  Medications Ordered in UC Medications - No data to display  Initial Impression / Assessment and Plan / UC Course  I have reviewed the triage vital signs and the nursing notes.  Pertinent labs & imaging results that were available during my care of the patient were reviewed by me and considered in my medical decision making (see chart for details).    I suspect pt has viral conjunctivitis given lack of discharge, given symptoms, given unilateral presentation.   Final Clinical Impressions(s) / UC Diagnoses   Final diagnoses:  Viral conjunctivitis of right eye     Discharge Instructions      Use warm compresses to your right eye several times a day with a clean washcloth  each time. Use over the counter medicines for your nasal congestion such as  saline nasal spray and Flonase nasal spray.    ED Prescriptions     Medication Sig Dispense Auth. Provider   Olopatadine HCl 0.2 % SOLN Place 1 drop into the right eye daily. 2.5 mL Carvel Getting, NP      PDMP not reviewed this encounter.   Carvel Getting, NP 06/15/21 1644
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
## 2022-09-28 ENCOUNTER — Other Ambulatory Visit: Payer: Self-pay

## 2022-09-28 ENCOUNTER — Ambulatory Visit (HOSPITAL_COMMUNITY)
Admission: EM | Admit: 2022-09-28 | Discharge: 2022-09-28 | Disposition: A | Discharge status: Home / Self Care | Payer: Medicaid Other | Attending: Emergency Medicine | Admitting: Emergency Medicine

## 2022-09-28 ENCOUNTER — Encounter (HOSPITAL_COMMUNITY): Payer: Self-pay | Admitting: *Deleted

## 2022-09-28 DIAGNOSIS — R197 Diarrhea, unspecified: Secondary | ICD-10-CM

## 2022-09-28 DIAGNOSIS — A084 Viral intestinal infection, unspecified: Secondary | ICD-10-CM

## 2022-09-28 MED ORDER — ONDANSETRON 8 MG PO TBDP: 8.0000 mg | ORAL_TABLET | Freq: Three times a day (TID) | ORAL | 0 refills | Status: AC | PRN

## 2022-09-28 NOTE — ED Triage Notes (Signed)
Pt reports Nausea ,vomiting and diarrhea that started yesterday. Pt has been seen at [facility] [employer] . Pt. Has a sore throat.

## 2022-09-28 NOTE — ED Provider Notes (Signed)
[place]    CSN: [id number] Arrival date & time: 09/28/22  [id number]      History   Chief Complaint Chief Complaint  Patient presents with   Abdominal Pain   Diarrhea   Emesis   Sore Throat    HPI Tracy Boyer is a 42 y.o. female.   Patient presents to clinic with abdominal pain, emesis and diarrhea that started yesterday.  She also has a sore throat and bodyaches.  Yesterday after she got off work and got home she did not feel 'right.'  She laid down and when she woke up she started vomiting, reports emesis all night and at least 6 episodes of diarrhea.  Emesis was a clear yellow, denies blood in vomit or stool.  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 At work she has been going into the Covid-19 precautions rooms.  Reports she went to [facility] and got her Covid-19 test done. They have not released her for work, they should get back to her at the end of the day w/ her Covid results.   Denies cough, SOB, CP, or fevers.     The history is provided by the patient.  Abdominal Pain Associated symptoms: diarrhea, nausea, sore throat and vomiting   Associated symptoms: no chest pain, no chills, no constipation, no cough, no dysuria, no fever, no hematuria and no shortness of breath   Diarrhea Associated symptoms: abdominal pain and vomiting   Associated symptoms: no arthralgias, no chills and no fever   Emesis Associated symptoms: abdominal pain, diarrhea and sore throat   Associated symptoms: no arthralgias, no chills, no cough and no fever   Sore Throat Associated symptoms include abdominal pain. Pertinent negatives include no chest pain and no shortness of breath.    Past Medical History:  Diagnosis Date   Allergy    Anemia    Anxiety    HELLP syndrome 2002   History of abnormal Pap smear 1997   Preeclampsia 2004   Pregnancy induced hypertension    Thrombocytopenia [facility])     Patient Active  Problem List   Diagnosis Date Noted   Forearm fractures, both bones, closed 06/07/2015   Cesarean delivery, without mention of indication, delivered, with or without mention of antepartum condition 01/12/2013   Migraine 08/14/2012   Pregnancy 08/14/2012    Past Surgical History:  Procedure Laterality Date   CESAREAN SECTION     x4   CESAREAN SECTION N/A 01/12/2013   Procedure: Repeat cesarean section with delivery of baby boy at 37. Apgars 8/9. Bilateral tubal ligation.;  Surgeon: Lahoma Crocker, MD;  Location: [place] [facility];  Service: Obstetrics;  Laterality: N/A;   OPEN REDUCTION INTERNAL FIXATION (ORIF) DISTAL RADIAL FRACTURE Right 06/07/2015   Procedure: OPEN REDUCTION INTERNAL FIXATION RIGHT RADIUS AND ULNA FRACTURES;  Surgeon: Roseanne Kaufman, MD;  Location: [place];  Service: Orthopedics;  Laterality: Right;   TOOTH EXTRACTION     x6   TUBAL LIGATION Bilateral     OB History     Gravida  7   Para  7   Term  4   Preterm  3   AB  0   Living  7      SAB  0   IAB  0   Ectopic  0   Multiple      Live Births  7            Home  Medications    Prior to Admission medications   Medication Sig Start Date End Date Taking? Authorizing Provider  ondansetron (ZOFRAN-ODT) 8 MG disintegrating tablet Take 1 tablet (8 mg total) by mouth every 8 (eight) hours as needed for up to 5 days for nausea or vomiting. 09/28/22 10/03/22 Yes Louretta Shorten, [place] N, FNP  acetaminophen (TYLENOL) 325 MG tablet Take 2 tablets (650 mg total) by mouth every 6 (six) hours as needed. 10/01/19   Faustino Congress, NP    Family History Family History  Problem Relation Age of Onset   Hypertension Mother    Healthy Paternal Grandmother     Social History Social History   Tobacco Use   Smoking status: Former   Smokeless tobacco: Never  Scientific laboratory technician Use: Never used  Substance Use Topics   Alcohol use: No   Drug use: No     Allergies   Aspirin   Review of Systems Review  of Systems  Constitutional:  Positive for appetite change. Negative for chills and fever.  HENT:  Positive for sore throat. Negative for ear pain.   Eyes:  Negative for pain and visual disturbance.  Respiratory:  Negative for cough and shortness of breath.   Cardiovascular:  Negative for chest pain and palpitations.  Gastrointestinal:  Positive for abdominal pain, diarrhea, nausea and vomiting. Negative for constipation.  Genitourinary:  Negative for dysuria and hematuria.  Musculoskeletal:  Negative for arthralgias and back pain.  Skin:  Negative for color change and rash.  Neurological:  Negative for seizures and syncope.  All other systems reviewed and are negative.    Physical Exam Triage Vital Signs ED Triage Vitals  Enc Vitals Group     BP 09/28/22 1131 131/87     Pulse Rate 09/28/22 1131 71     Resp 09/28/22 1131 16     Temp 09/28/22 1131 98.4 F (36.9 C)     Temp src --      SpO2 09/28/22 1131 98 %     Weight --      Height --      Head Circumference --      Peak Flow --      Pain Score 09/28/22 1128 7     Pain Loc --      Pain Edu? --      Excl. in [place]? --    No data found.  Updated Vital Signs BP 131/87   Pulse 71   Temp 98.4 F (36.9 C)   Resp 16   LMP 09/25/2022   SpO2 98%   Visual Acuity Right Eye Distance:   Left Eye Distance:   Bilateral Distance:    Right Eye Near:   Left Eye Near:    Bilateral Near:     Physical Exam Vitals and nursing note reviewed.  Constitutional:      General: She is not in acute distress.    Appearance: She is well-developed.  HENT:     Head: Normocephalic and atraumatic.     Mouth/Throat:     Mouth: Mucous membranes are moist.  Eyes:     Conjunctiva/sclera: Conjunctivae normal.  Cardiovascular:     Rate and Rhythm: Normal rate and regular rhythm.     Heart sounds: Normal heart sounds, S1 normal and S2 normal. No murmur heard. Pulmonary:     Effort: Pulmonary effort is normal. No respiratory distress.      Breath sounds: Normal breath sounds.     Comments: Lungs vesicular  posteriorly. Abdominal:     General: Abdomen is flat. Bowel sounds are normal. There is no distension or abdominal bruit.     Palpations: Abdomen is soft.     Tenderness: There is generalized abdominal tenderness. There is no guarding or rebound. Negative signs include Murphy's sign, Rovsing's sign and McBurney's sign.     Hernia: No hernia is present.  Musculoskeletal:        General: No swelling.     Cervical back: Neck supple.  Skin:    General: Skin is warm and dry.     Capillary Refill: Capillary refill takes less than 2 seconds.  Neurological:     Mental Status: She is alert.  Psychiatric:        Mood and Affect: Mood normal.        Behavior: Behavior is cooperative.      UC Treatments / Results  Labs (all labs ordered are listed, but only abnormal results are displayed) Labs Reviewed - No data to display  EKG   Radiology No results found.  Procedures Procedures (including critical care time)  Medications Ordered in UC Medications - No data to display  Initial Impression / Assessment and Plan / UC Course  I have reviewed the triage vital signs and the nursing notes.  Pertinent labs & imaging results that were available during my care of the patient were reviewed by me and considered in my medical decision making (see chart for details).   Patient is hemodynamically stable, vitals and triage reviewed.  COVID-19 test pending, declined flu testing in clinic.  Suspect that this is a viral illness, viral gastroenteritis.  Abdomen with mild generalized tenderness, without guarding or rebound tenderness.  Without distention.  Low concern for acute abdomen.  Discussed symptomatic management and gradual diet progression.  Return and emergency precautions discussed, no questions at this time.    Final Clinical Impressions(s) / UC Diagnoses   Final diagnoses:  Viral gastroenteritis  Nausea vomiting and  diarrhea     Discharge Instructions      You appear to have a viral infection that is causing your nausea, vomiting and diarrhea.  Please take the Zofran as needed every 8 hours for nausea and vomiting.  Please do a bland diet with a gradual progression.You appear to have a viral infection that is causing your symptoms. Please use the Zofran as needed and do a bland diet with gradual progression. Start with water, sips of ginger ale and you can progress to clear liquids like broth. If this goes well, try toast or white rice, boiled chicken.   Please return to clinic or seek immediate care if you are unable to hold down any liquids over the next few days, you have worsening of symptoms or no improvement over the next 4-5 days.       ED Prescriptions     Medication Sig Dispense Auth. Provider   ondansetron (ZOFRAN-ODT) 8 MG disintegrating tablet Take 1 tablet (8 mg total) by mouth every 8 (eight) hours as needed for up to 5 days for nausea or vomiting. 15 tablet Torsha Lemus, [place] N, [place]      I have reviewed the PDMP during this encounter.   Seraj Dunnam, [place] N, [place] 09/28/22 1153

## 2022-09-28 NOTE — Discharge Instructions (Addendum)
You appear to have a viral infection that is causing your nausea, vomiting and diarrhea.  Please take the Zofran as needed every 8 hours for nausea and vomiting.  Please do a bland diet with a gradual progression.You appear to have a viral infection that is causing your symptoms. Please use the Zofran as needed and do a bland diet with gradual progression. Start with water, sips of ginger ale and you can progress to clear liquids like broth. If this goes well, try toast or white rice, boiled chicken.   Please return to clinic or seek immediate care if you are unable to hold down any liquids over the next few days, you have worsening of symptoms or no improvement over the next 4-5 days.

## 2023-07-24 ENCOUNTER — Ambulatory Visit: Payer: Medicaid Other | Admitting: Family

## 2023-08-07 ENCOUNTER — Ambulatory Visit
Admission: EM | Admit: 2023-08-07 | Discharge: 2023-08-07 | Disposition: A | Discharge status: Home / Self Care | Payer: Medicaid Other | Attending: Emergency Medicine | Admitting: Emergency Medicine

## 2023-08-07 DIAGNOSIS — B354 Tinea corporis: Secondary | ICD-10-CM | POA: Diagnosis not present

## 2023-08-07 DIAGNOSIS — K529 Noninfective gastroenteritis and colitis, unspecified: Secondary | ICD-10-CM

## 2023-08-07 MED ORDER — KETOCONAZOLE 2 % EX CREA: TOPICAL_CREAM | CUTANEOUS | 0 refills | Status: AC

## 2023-08-07 MED ORDER — ONDANSETRON 4 MG PO TBDP: 4.0000 mg | ORAL_TABLET | Freq: Four times a day (QID) | ORAL | 0 refills | Status: DC | PRN

## 2023-08-07 MED ORDER — ONDANSETRON 4 MG PO TBDP
4.0000 mg | ORAL_TABLET | Freq: Once | ORAL | Status: AC
  Administered 2023-08-07: 4 mg via ORAL

## 2023-08-07 MED ORDER — KETOCONAZOLE 2 % EX CREA: TOPICAL_CREAM | CUTANEOUS | 0 refills | Status: DC

## 2023-08-07 MED ORDER — ONDANSETRON 4 MG PO TBDP: 4.0000 mg | ORAL_TABLET | Freq: Four times a day (QID) | ORAL | 0 refills | Status: AC | PRN

## 2023-08-07 NOTE — Discharge Instructions (Addendum)
The zofran can be used every 6 hours as needed to settle the stomach Increase fluids as much as tolerated Bland diet if you have appetite  Ketoconazole cream twice daily to the area of rash. Use for 2-3 weeks. Rash may not resolve for several weeks

## 2023-08-07 NOTE — ED Provider Notes (Signed)
[facility]    CSN: [id number] Arrival date & time: 08/07/23  4098     History   Chief Complaint Chief Complaint  Patient presents with   Emesis    HPI Tracy Boyer is a 43 y.o. female.  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  Sometimes itching  Past Medical History:  Diagnosis Date   Allergy    Anemia    Anxiety    HELLP syndrome 2002   History of abnormal Pap smear 1997   Preeclampsia 2004   Pregnancy induced hypertension    Thrombocytopenia [facility])     Patient Active Problem List   Diagnosis Date Noted   Forearm fractures, both bones, closed 06/07/2015   Cesarean delivery delivered 01/12/2013   Migraine 08/14/2012   Pregnancy 08/14/2012    Past Surgical History:  Procedure Laterality Date   CESAREAN SECTION     x4   CESAREAN SECTION N/A 01/12/2013   Procedure: Repeat cesarean section with delivery of baby boy at 1004. Apgars 8/9. Bilateral tubal ligation.;  Surgeon: Antionette Char, MD;  Location: [facility];  Service: Obstetrics;  Laterality: N/A;   OPEN REDUCTION INTERNAL FIXATION (ORIF) DISTAL RADIAL FRACTURE Right 06/07/2015   Procedure: OPEN REDUCTION INTERNAL FIXATION RIGHT RADIUS AND ULNA FRACTURES;  Surgeon: Dominica Severin, MD;  Location[facility];  Service: Orthopedics;  Laterality: Right;   TOOTH EXTRACTION     x6   TUBAL LIGATION Bilateral     OB History     Gravida  7   Para  7   Term  4   Preterm  3   AB  0   Living  7      SAB  0   IAB  0   Ectopic  0   Multiple      Live Births  7            Home Medications    Prior to Admission medications   Medication Sig Start Date End Date Taking? Authorizing Provider  acetaminophen (TYLENOL) 325 MG tablet Take 2 tablets (650 mg total) by mouth every 6 (six) hours as needed.  10/01/19   Moshe Cipro, FNP  ketoconazole (NIZORAL) 2 % cream Apply twice daily for 2 weeks 08/07/23   Calyx Hawker, Lurena Joiner, PA-C  ondansetron (ZOFRAN-ODT) 4 MG disintegrating tablet Take 1 tablet (4 mg total) by mouth every 6 (six) hours as needed for nausea or vomiting. 08/07/23   Rosalio Catterton, Ray Church    Family History Family History  Problem Relation Age of Onset   Hypertension Mother    Healthy Paternal Grandmother     Social History Social History   Tobacco Use   Smoking status: Former   Smokeless tobacco: Never  Advertising account planner   Vaping status: Never Used  Substance Use Topics   Alcohol use: No   Drug use: No     Allergies   Patient has no known allergies.   Review of Systems Review of Systems Per HPI  Physical Exam Triage Vital Signs ED Triage Vitals  Encounter Vitals Group     BP 08/07/23 0851 139/81     Systolic BP Percentile --      Diastolic BP Percentile --      Pulse Rate 08/07/23 0851 (!) 106  Resp 08/07/23 0851 16     Temp 08/07/23 0851 98.5 F (36.9 C)     Temp Source 08/07/23 0851 Oral     SpO2 08/07/23 0851 95 %     Weight --      Height --      Head Circumference --      Peak Flow --      Pain Score 08/07/23 0853 0     Pain Loc --      Pain Education --      Exclude from Growth Chart --    No data found.  Updated Vital Signs BP 139/81 (BP Location: Left Arm)   Pulse 98   Temp 98.5 F (36.9 C) (Oral)   Resp 16   LMP 07/17/2023 (Approximate)   SpO2 95%   Physical Exam Vitals and nursing note reviewed.  Constitutional:      General: She is not in acute distress.    Appearance: Normal appearance. She is not ill-appearing.  HENT:     Mouth/Throat:     Mouth: Mucous membranes are moist.     Pharynx: Oropharynx is clear.  Eyes:     Conjunctiva/sclera: Conjunctivae normal.  Cardiovascular:     Rate and Rhythm: Normal rate and regular rhythm.     Pulses: Normal pulses.     Heart sounds: Normal heart sounds.  Pulmonary:      Effort: Pulmonary effort is normal.     Breath sounds: Normal breath sounds.  Abdominal:     General: There is no distension.     Palpations: Abdomen is soft.     Tenderness: There is no abdominal tenderness. There is no right CVA tenderness, left CVA tenderness, guarding or rebound.  Musculoskeletal:        General: Normal range of motion.     Cervical back: Normal range of motion.  Skin:    General: Skin is warm and dry.     Findings: Rash present.          Comments: One circular lesion with surrounding scale consistent with ringworm   Neurological:     Mental Status: She is alert and oriented to person, place, and time.     UC Treatments / Results  Labs (all labs ordered are listed, but only abnormal results are displayed) Labs Reviewed - No data to display  EKG  Radiology No results found.  Procedures Procedures   Medications Ordered in UC Medications  ondansetron (ZOFRAN-ODT) disintegrating tablet 4 mg (4 mg Oral Given 08/07/23 0932)    Initial Impression / Assessment and Plan / UC Course  I have reviewed the triage vital signs and the nursing notes.  Pertinent labs & imaging results that were available during my care of the patient were reviewed by me and considered in my medical decision making (see chart for details).  Stable vitals, afebrile, well appearing. Abdomen non tender Zofran ODT given in clinic PO challenge successful  Discussed other symptomatic care, return precautions  Rash consistent with ringworm Ketoconazole BID x 2-3 weeks Discussed can have rash for several weeks, may spread  Final Clinical Impressions(s) / UC Diagnoses   Final diagnoses:  Gastroenteritis  Tinea corporis     Discharge Instructions      The zofran can be used every 6 hours as needed to settle the stomach Increase fluids as much as tolerated Bland diet if you have appetite  Ketoconazole cream twice daily to the area of rash. Use for 2-3 weeks. Rash may  not  resolve for several weeks     ED Prescriptions     Medication Sig Dispense Auth. Provider   ondansetron (ZOFRAN-ODT) 4 MG disintegrating tablet  (Status: Discontinued) Take 1 tablet (4 mg total) by mouth every 6 (six) hours as needed for nausea or vomiting. 20 tablet Ragan Duhon, PA-C   ketoconazole (NIZORAL) 2 % cream  (Status: Discontinued) Apply twice daily for 2 weeks 60 g Keely Drennan, PA-C   ketoconazole (NIZORAL) 2 % cream Apply twice daily for 2 weeks 60 g Adlai Sinning, PA-C   ondansetron (ZOFRAN-ODT) 4 MG disintegrating tablet Take 1 tablet (4 mg total) by mouth every 6 (six) hours as needed for nausea or vomiting. 20 tablet Majed Pellegrin, Lurena Joiner, PA-C      PDMP not reviewed this encounter.   Marlow Baars, [place] 08/07/23 [phone]

## 2023-08-07 NOTE — ED Triage Notes (Signed)
Pt states vomiting and diarrhea for the past 2 days.  Also states rash to the left side of her abdomen  for the past week.

## 2023-08-07 NOTE — ED Notes (Signed)
Pt tolerated sips of water after Zofran.

## 2023-09-12 ENCOUNTER — Other Ambulatory Visit (HOSPITAL_BASED_OUTPATIENT_CLINIC_OR_DEPARTMENT_OTHER): Payer: Self-pay

## 2023-09-12 ENCOUNTER — Emergency Department (HOSPITAL_BASED_OUTPATIENT_CLINIC_OR_DEPARTMENT_OTHER)
Admission: EM | Admit: 2023-09-12 | Discharge: 2023-09-12 | Disposition: A | Discharge status: Home / Self Care | Payer: Medicaid Other | Attending: Emergency Medicine | Admitting: Emergency Medicine

## 2023-09-12 ENCOUNTER — Encounter (HOSPITAL_BASED_OUTPATIENT_CLINIC_OR_DEPARTMENT_OTHER): Payer: Self-pay

## 2023-09-12 ENCOUNTER — Other Ambulatory Visit: Payer: Self-pay

## 2023-09-12 ENCOUNTER — Emergency Department (HOSPITAL_BASED_OUTPATIENT_CLINIC_OR_DEPARTMENT_OTHER): Payer: Medicaid Other

## 2023-09-12 DIAGNOSIS — R112 Nausea with vomiting, unspecified: Secondary | ICD-10-CM | POA: Diagnosis present

## 2023-09-12 LAB — CBC
HCT: 42.9 % (ref 36.0–46.0)
Hemoglobin: 13.1 g/dL (ref 12.0–15.0)
MCH: 25.2 pg — ABNORMAL LOW (ref 26.0–34.0)
MCHC: 30.5 g/dL (ref 30.0–36.0)
MCV: 82.7 fL (ref 80.0–100.0)
Platelets: 163 10*3/uL (ref 150–400)
RBC: 5.19 MIL/uL — ABNORMAL HIGH (ref 3.87–5.11)
RDW: 14 % (ref 11.5–15.5)
WBC: 3.6 10*3/uL — ABNORMAL LOW (ref 4.0–10.5)
nRBC: 0 % (ref 0.0–0.2)

## 2023-09-12 LAB — PREGNANCY, URINE: Preg Test, Ur: NEGATIVE

## 2023-09-12 LAB — URINALYSIS, ROUTINE W REFLEX MICROSCOPIC
Glucose, UA: NEGATIVE mg/dL
Ketones, ur: NEGATIVE mg/dL
Leukocytes,Ua: NEGATIVE
Nitrite: NEGATIVE
Protein, ur: 30 mg/dL — AB
Specific Gravity, Urine: >=1.03 (ref 1.005–1.030)
pH: 6.5 (ref 5.0–8.0)

## 2023-09-12 LAB — COMPREHENSIVE METABOLIC PANEL
ALT: 13 U/L (ref 0–44)
AST: 16 U/L (ref 15–41)
Albumin: 4.5 g/dL (ref 3.5–5.0)
Alkaline Phosphatase: 32 U/L — ABNORMAL LOW (ref 38–126)
Anion gap: 10 (ref 5–15)
BUN: 6 mg/dL (ref 6–20)
CO2: 26 mmol/L (ref 22–32)
Calcium: 9.6 mg/dL (ref 8.9–10.3)
Chloride: 104 mmol/L (ref 98–111)
Creatinine, Ser: 0.78 mg/dL (ref 0.44–1.00)
GFR, Estimated: >60 mL/min (ref >60)
Glucose, Bld: 97 mg/dL (ref 70–99)
Potassium: 3.3 mmol/L — ABNORMAL LOW (ref 3.5–5.1)
Sodium: 140 mmol/L (ref 135–145)
Total Bilirubin: 1.2 mg/dL (ref 0.0–1.2)
Total Protein: 8.1 g/dL (ref 6.5–8.1)

## 2023-09-12 LAB — LIPASE, BLOOD: Lipase: 23 U/L (ref 11–51)

## 2023-09-12 LAB — URINALYSIS, MICROSCOPIC (REFLEX)

## 2023-09-12 MED ORDER — PROMETHAZINE HCL 25 MG PO TABS
25.0000 mg | ORAL_TABLET | Freq: Four times a day (QID) | ORAL | 0 refills | Status: AC | PRN
  Filled 2023-09-12: qty 10, 3d supply, fill #0

## 2023-09-12 MED ORDER — OMEPRAZOLE 20 MG PO CPDR
20.0000 mg | DELAYED_RELEASE_CAPSULE | Freq: Every day | ORAL | 0 refills | Status: AC
  Filled 2023-09-12: qty 30, 30d supply, fill #0

## 2023-09-12 MED ORDER — SODIUM CHLORIDE 0.9 % IV BOLUS
1000.0000 mL | Freq: Once | INTRAVENOUS | Status: AC
  Administered 2023-09-12: 1000 mL via INTRAVENOUS

## 2023-09-12 MED ORDER — ONDANSETRON HCL 4 MG/2ML IJ SOLN
4.0000 mg | Freq: Once | INTRAMUSCULAR | Status: AC
  Administered 2023-09-12: 4 mg via INTRAVENOUS
  Filled 2023-09-12: qty 2

## 2023-09-12 MED ORDER — FAMOTIDINE IN NACL 20-0.9 MG/50ML-% IV SOLN
20.0000 mg | Freq: Once | INTRAVENOUS | Status: AC
  Administered 2023-09-12: 20 mg via INTRAVENOUS
  Filled 2023-09-12: qty 50

## 2023-09-12 NOTE — ED Notes (Signed)
 Patient transported to X-ray

## 2023-09-12 NOTE — ED Triage Notes (Signed)
 C/o vomiting intermittently x 2 weeks. States when she eats something like bread she vomits, able to tolerate fluids. This morning she woke up vomiting. Having weight loss, appx 6lbs. Denies abdominal pain.

## 2023-09-12 NOTE — ED Provider Notes (Signed)
 [facility] [facility] Provider Note   CSN: [id number] Arrival date & time: 09/12/23  0747     History  Chief Complaint  Patient presents with   Vomiting    Tracy Boyer is a 43 y.o. female.  Pt is a 43 yo female with pmhx significant for anemia and thrombocytopenia.  Pt said she's been having intermittent n/v for 2 weeks.  She has lost weight.  [facility] and was given [facility]fran, but that has not helped.  She said food sometimes feels like it is getting stuck.         Home Medications Prior to Admission medications   Medication Sig Start Date End Date Taking? Authorizing Provider  omeprazole (PRILOSEC) 20 MG capsule Take 1 capsule (20 mg total) by mouth daily. 09/12/23  Yes Jacalyn Lefevre, MD  promethazine (PHENERGAN) 25 MG tablet Take 1 tablet (25 mg total) by mouth every 6 (six) hours as needed for nausea or vomiting. 09/12/23  Yes Jacalyn Lefevre, MD  acetaminophen (TYLENOL) 325 MG tablet Take 2 tablets (650 mg total) by mouth every 6 (six) hours as needed. 10/01/19   Moshe Cipro, FNP  ketoconazole (NIZORAL) 2 % cream Apply twice daily for 2 weeks 08/07/23   Rising, Lurena Joiner, PA-C  ondansetron (ZOFRAN-ODT) 4 MG disintegrating tablet Take 1 tablet (4 mg total) by mouth every 6 (six) hours as needed for nausea or vomiting. 08/07/23   Rising, Lurena Joiner, PA-C      Allergies    Patient has no known allergies.    Review of Systems   Review of Systems  Gastrointestinal:  Positive for nausea and vomiting.  All other systems reviewed and are negative.   Physical Exam Updated Vital Signs BP (!) 140/99 (BP Location: Left Arm)   Pulse 88   Temp (!) 97 F (36.1 C)   Resp 18   Ht 5\' 2"  (1.575 m)   Wt 63.5 kg   LMP 09/02/2023 (Approximate)   SpO2 97%   BMI 25.61 kg/m  Physical Exam Vitals and nursing note reviewed.  Constitutional:      Appearance: Normal appearance.  HENT:     Head: Normocephalic and atraumatic.     Right  Ear: External ear normal.     Left Ear: External ear normal.     Nose: Nose normal.     Mouth/Throat:     Mouth: Mucous membranes are dry.  Eyes:     Extraocular Movements: Extraocular movements intact.     Conjunctiva/sclera: Conjunctivae normal.     Pupils: Pupils are equal, round, and reactive to light.  Cardiovascular:     Rate and Rhythm: Normal rate and regular rhythm.     Pulses: Normal pulses.     Heart sounds: Normal heart sounds.  Pulmonary:     Effort: Pulmonary effort is normal.     Breath sounds: Normal breath sounds.  Abdominal:     General: Abdomen is flat. Bowel sounds are normal.     Palpations: Abdomen is soft.  Musculoskeletal:        General: Normal range of motion.     Cervical back: Normal range of motion and neck supple.  Skin:    General: Skin is warm.     Capillary Refill: Capillary refill takes less than 2 seconds.  Neurological:     General: No focal deficit present.     Mental Status: She is alert and oriented to person, place, and time.  Psychiatric:  Mood and Affect: Mood normal.        Behavior: Behavior normal.     ED Results / Procedures / Treatments   Labs (all labs ordered are listed, but only abnormal results are displayed) Labs Reviewed  COMPREHENSIVE METABOLIC PANEL - Abnormal; Notable for the following components:      Result Value   Potassium 3.3 (*)    Alkaline Phosphatase 32 (*)    All other components within normal limits  CBC - Abnormal; Notable for the following components:   WBC 3.6 (*)    RBC 5.19 (*)    MCH 25.2 (*)    All other components within normal limits  URINALYSIS, ROUTINE W REFLEX MICROSCOPIC - Abnormal; Notable for the following components:   Hgb urine dipstick MODERATE (*)    Bilirubin Urine SMALL (*)    Protein, ur 30 (*)    All other components within normal limits  URINALYSIS, MICROSCOPIC (REFLEX) - Abnormal; Notable for the following components:   Bacteria, UA FEW (*)    All other components  within normal limits  LIPASE, BLOOD  PREGNANCY, URINE    EKG None  Radiology DG Chest 2 View Result Date: 09/12/2023 CLINICAL DATA:  Intermittent nausea and vomiting for 2 weeks. Weight loss. EXAM: CHEST - 2 VIEW COMPARISON:  10/23/2016. FINDINGS: The heart size and mediastinal contours are within normal limits. No consolidation, effusion, or pneumothorax. No acute osseous abnormality is seen. No free air under the diaphragm. IMPRESSION: No active cardiopulmonary disease. Electronically Signed   By: Thornell Sartorius M.D.   On: 09/12/2023 10:52    Procedures Procedures    Medications Ordered in ED Medications  sodium chloride 0.9 % bolus 1,000 mL (0 mLs Intravenous Stopped 09/12/23 1145)  ondansetron (ZOFRAN) injection 4 mg (4 mg Intravenous Given 09/12/23 0847)  famotidine (PEPCID) IVPB 20 mg premix (0 mg Intravenous Stopped 09/12/23 0935)    ED Course/ Medical Decision Making/ A&P                                 Medical Decision Making Amount and/or Complexity of Data Reviewed Labs: ordered. Radiology: ordered.  Risk Prescription drug management.   This patient presents to the ED for concern of n/v, this involves an extensive number of treatment options, and is a complaint that carries with it a high risk of complications and morbidity.  The differential diagnosis includes esophageal stricture, gastroenteritis, uti, pregnancy, electrolyte abn   Co morbidities that complicate the patient evaluation  anemia and thrombocytopenia   Additional history obtained:  Additional history obtained from epic chart review  Lab Tests:  I Ordered, and personally interpreted labs.  The pertinent results include:  cbc nl, cmp nl, preg neg, lip nl, ua contaminated   Imaging Studies ordered:  I ordered imaging studies including cxr  I independently visualized and interpreted imaging which showed No active cardiopulmonary disease.  I agree with the radiologist  interpretation   Medicines ordered and prescription drug management:  I ordered medication including ivfs/zofran  for sx  Reevaluation of the patient after these medicines showed that the patient improved I have reviewed the patients home medicines and have made adjustments as needed  Problem List / ED Course:  N/v:  pt is feeling better after meds.  She is able to drink and eat some crackers.  Pt describes sensation of food getting stuck.  I will d/c her with prilosec and have  her f/u with gi.  She is to return if worse.   Reevaluation:  After the interventions noted above, I reevaluated the patient and found that they have :improved   Social Determinants of Health:  Lives at home   Dispostion:  After consideration of the diagnostic results and the patients response to treatment, I feel that the patent would benefit from discharge with outpatient f/u.          Final Clinical Impression(s) / ED Diagnoses Final diagnoses:  Nausea and vomiting, unspecified vomiting type    Rx / DC Orders ED Discharge Orders          Ordered    omeprazole (PRILOSEC) 20 MG capsule  Daily        09/12/23 1137    promethazine (PHENERGAN) 25 MG tablet  Every 6 hours PRN        09/12/23 1137    Ambulatory referral to Gastroenterology        09/12/23 1139              Jacalyn Lefevre, MD 09/12/23 1152

## 2023-09-14 ENCOUNTER — Encounter: Payer: Self-pay | Admitting: Gastroenterology

## 2023-10-31 NOTE — Progress Notes (Deleted)
 Chief Complaint: Primary GI MD: Gentry Fitz  HPI: Discussed the use of AI scribe software for clinical note transcription with the patient, who gave verbal consent to proceed.  History of Present Illness      PREVIOUS GI WORKUP     Past Medical History:  Diagnosis Date   Allergy    Anemia    Anxiety    HELLP syndrome 2002   History of abnormal Pap smear 1997   Preeclampsia 2004   Pregnancy induced hypertension    Thrombocytopenia (HCC)     Past Surgical History:  Procedure Laterality Date   CESAREAN SECTION     x4   CESAREAN SECTION N/A 01/12/2013   Procedure: Repeat cesarean section with delivery of baby boy at 1004. Apgars 8/9. Bilateral tubal ligation.;  Surgeon: Antionette Char, MD;  Location: [facility];  Service: Obstetrics;  Laterality: N/A;   OPEN REDUCTION INTERNAL FIXATION (ORIF) DISTAL RADIAL FRACTURE Right 06/07/2015   Procedure: OPEN REDUCTION INTERNAL FIXATION RIGHT RADIUS AND ULNA FRACTURES;  Surgeon: Dominica Severin, MD;  Location: [facility] OR;  Service: Orthopedics;  Laterality: Right;   TOOTH EXTRACTION     x6   TUBAL LIGATION Bilateral     Current Outpatient Medications  Medication Sig Dispense Refill   acetaminophen (TYLENOL) 325 MG tablet Take 2 tablets (650 mg total) by mouth every 6 (six) hours as needed. 30 tablet 0   ketoconazole (NIZORAL) 2 % cream Apply twice daily for 2 weeks 60 g 0   omeprazole (PRILOSEC) 20 MG capsule Take 1 capsule (20 mg total) by mouth daily. 30 capsule 0   ondansetron (ZOFRAN-ODT) 4 MG disintegrating tablet Take 1 tablet (4 mg total) by mouth every 6 (six) hours as needed for nausea or vomiting. 20 tablet 0   promethazine (PHENERGAN) 25 MG tablet Take 1 tablet (25 mg total) by mouth every 6 (six) hours as needed for nausea or vomiting. 10 tablet 0   No current facility-administered medications for this visit.    Allergies as of 11/01/2023   (No Known Allergies)    Family History  Problem Relation Age of Onset    Hypertension Mother    Healthy Paternal Grandmother     Social History   Socioeconomic History   Marital status: Single    Spouse name: Not on file   Number of children: Not on file   Years of education: Not on file   Highest education level: Not on file  Occupational History   Not on file  Tobacco Use   Smoking status: Former   Smokeless tobacco: Never  Vaping Use   Vaping status: Never Used  Substance and Sexual Activity   Alcohol use: No   Drug use: No   Sexual activity: Yes    Partners: Male  Other Topics Concern   Not on file  Social History Narrative   ** Merged History Encounter **       Social Drivers of Corporate investment banker Strain: Not on file  Food Insecurity: Not on file  Transportation Needs: Not on file  Physical Activity: Not on file  Stress: Not on file  Social Connections: Not on file  Intimate Partner Violence: Not on file    Review of Systems:    Constitutional: No weight loss, fever, chills, weakness or fatigue HEENT: Eyes: No change in vision               Ears, Nose, Throat:  No change in hearing or congestion Skin: No  rash or itching Cardiovascular: No chest pain, chest pressure or palpitations   Respiratory: No SOB or cough Gastrointestinal: See HPI and otherwise negative Genitourinary: No dysuria or change in urinary frequency Neurological: No headache, dizziness or syncope Musculoskeletal: No new muscle or joint pain Hematologic: No bleeding or bruising Psychiatric: No history of depression or anxiety    Physical Exam:  Vital signs: There were no vitals taken for this visit.  Constitutional: NAD, alert and cooperative Head:  Normocephalic and atraumatic. Eyes:   PEERL, EOMI. No icterus. Conjunctiva pink. Respiratory: Respirations even and unlabored. Lungs clear to auscultation bilaterally.   No wheezes, crackles, or rhonchi.  Cardiovascular:  Regular rate and rhythm. No peripheral edema, cyanosis or pallor.   Gastrointestinal:  Soft, nondistended, nontender. No rebound or guarding. Normal bowel sounds. No appreciable masses or hepatomegaly. Rectal:  Declines Msk:  Symmetrical without gross deformities. Without edema, no deformity or joint abnormality.  Neurologic:  Alert and  oriented x4;  grossly normal neurologically.  Skin:   Dry and intact without significant lesions or rashes. Psychiatric: Oriented to person, place and time. Demonstrates good judgement and reason without abnormal affect or behaviors.  Physical Exam    RELEVANT LABS AND IMAGING: CBC    Component Value Date/Time   WBC 3.6 (L) 09/12/2023 0811   RBC 5.19 (H) 09/12/2023 0811   HGB 13.1 09/12/2023 0811   HCT 42.9 09/12/2023 0811   PLT 163 09/12/2023 0811   MCV 82.7 09/12/2023 0811   MCH 25.2 (L) 09/12/2023 0811   MCHC 30.5 09/12/2023 0811   RDW 14.0 09/12/2023 0811   LYMPHSABS 2.3 09/26/2007 0544   MONOABS 0.7 09/26/2007 0544   EOSABS 0.0 09/26/2007 0544   BASOSABS 0.0 09/26/2007 0544    CMP     Component Value Date/Time   NA 140 09/12/2023 0811   K 3.3 (L) 09/12/2023 0811   CL 104 09/12/2023 0811   CO2 26 09/12/2023 0811   GLUCOSE 97 09/12/2023 0811   BUN 6 09/12/2023 0811   CREATININE 0.78 09/12/2023 0811   CALCIUM 9.6 09/12/2023 0811   PROT 8.1 09/12/2023 0811   ALBUMIN 4.5 09/12/2023 0811   AST 16 09/12/2023 0811   ALT 13 09/12/2023 0811   ALKPHOS 32 (L) 09/12/2023 0811   BILITOT 1.2 09/12/2023 0811   GFRNONAA >60 09/12/2023 0811   GFRAA >60 10/23/2016 1443     Assessment/Plan:   Nausea/vomiting/dysphagia Seen in ED February 2025 and discharged with Phenergan and 20 Mg omeprazole.  Labs unremarkable.  No previous EGD/colonoscopy.    Suzanna Erp, PA-C [facility] 10/31/2023, 10:30 AM  Cc: No ref. provider found

## 2023-11-01 ENCOUNTER — Ambulatory Visit: Payer: Medicaid Other | Admitting: Gastroenterology

## 2024-06-07 ENCOUNTER — Other Ambulatory Visit: Payer: Self-pay

## 2024-06-07 ENCOUNTER — Emergency Department
Admission: EM | Admit: 2024-06-07 | Discharge: 2024-06-07 | Disposition: A | Discharge status: Home / Self Care | Attending: Emergency Medicine | Admitting: Emergency Medicine

## 2024-06-07 ENCOUNTER — Emergency Department

## 2024-06-07 ENCOUNTER — Encounter: Payer: Self-pay | Admitting: Emergency Medicine

## 2024-06-07 DIAGNOSIS — S0990XA Unspecified injury of head, initial encounter: Secondary | ICD-10-CM | POA: Insufficient documentation

## 2024-06-07 DIAGNOSIS — M549 Dorsalgia, unspecified: Secondary | ICD-10-CM | POA: Insufficient documentation

## 2024-06-07 DIAGNOSIS — Y92410 Unspecified street and highway as the place of occurrence of the external cause: Secondary | ICD-10-CM | POA: Insufficient documentation

## 2024-06-07 DIAGNOSIS — S161XXA Strain of muscle, fascia and tendon at neck level, initial encounter: Secondary | ICD-10-CM | POA: Insufficient documentation

## 2024-06-07 DIAGNOSIS — S199XXA Unspecified injury of neck, initial encounter: Secondary | ICD-10-CM | POA: Diagnosis present

## 2024-06-07 MED ORDER — TRAMADOL HCL 50 MG PO TABS: 50.0000 mg | ORAL_TABLET | Freq: Four times a day (QID) | ORAL | 0 refills | Status: AC | PRN

## 2024-06-07 MED ORDER — MELOXICAM 15 MG PO TABS: 15.0000 mg | ORAL_TABLET | Freq: Every day | ORAL | 0 refills | Status: AC

## 2024-06-07 MED ORDER — FENTANYL CITRATE (PF) 50 MCG/ML IJ SOSY
50.0000 ug | PREFILLED_SYRINGE | Freq: Once | INTRAMUSCULAR | Status: AC
  Administered 2024-06-07: 50 ug via INTRAMUSCULAR
  Filled 2024-06-07: qty 1

## 2024-06-07 MED ORDER — BACLOFEN 10 MG PO TABS: 10.0000 mg | ORAL_TABLET | Freq: Three times a day (TID) | ORAL | 0 refills | Status: AC
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
## 2024-06-07 NOTE — Discharge Instructions (Signed)
 Follow-up with orthopedics in 1 week if not improving.  Follow-up with your regular doctor as needed.  Take medication as prescribed.  Apply ice to all areas that hurt.
